# Patient Record
Sex: Female | Born: 1995 | Race: Black or African American | Hispanic: No | Marital: Single | Smoking: Never smoker
Health system: Southern US, Community
[De-identification: ages and names within clinical notes are randomized; demographics above are authoritative.]

## PROBLEM LIST (undated history)

## (undated) DIAGNOSIS — C801 Malignant (primary) neoplasm, unspecified: Secondary | ICD-10-CM

## (undated) DIAGNOSIS — J45909 Unspecified asthma, uncomplicated: Secondary | ICD-10-CM

## (undated) HISTORY — PX: BREAST SURGERY: SHX581

---

## 2013-04-01 DIAGNOSIS — O009 Unspecified ectopic pregnancy without intrauterine pregnancy: Secondary | ICD-10-CM

## 2013-04-01 HISTORY — DX: Unspecified ectopic pregnancy without intrauterine pregnancy: O00.90

## 2019-04-30 ENCOUNTER — Emergency Department (HOSPITAL_BASED_OUTPATIENT_CLINIC_OR_DEPARTMENT_OTHER): Payer: Medicaid Other

## 2019-04-30 ENCOUNTER — Other Ambulatory Visit: Payer: Self-pay

## 2019-04-30 ENCOUNTER — Emergency Department (HOSPITAL_BASED_OUTPATIENT_CLINIC_OR_DEPARTMENT_OTHER)
Admission: EM | Admit: 2019-04-30 | Discharge: 2019-04-30 | Disposition: A | Discharge status: Home / Self Care | Payer: Medicaid Other | Attending: Emergency Medicine | Admitting: Emergency Medicine

## 2019-04-30 ENCOUNTER — Encounter (HOSPITAL_BASED_OUTPATIENT_CLINIC_OR_DEPARTMENT_OTHER): Payer: Self-pay

## 2019-04-30 DIAGNOSIS — J45909 Unspecified asthma, uncomplicated: Secondary | ICD-10-CM | POA: Insufficient documentation

## 2019-04-30 DIAGNOSIS — Z3A00 Weeks of gestation of pregnancy not specified: Secondary | ICD-10-CM | POA: Diagnosis not present

## 2019-04-30 DIAGNOSIS — Z8759 Personal history of other complications of pregnancy, childbirth and the puerperium: Secondary | ICD-10-CM | POA: Insufficient documentation

## 2019-04-30 DIAGNOSIS — O209 Hemorrhage in early pregnancy, unspecified: Secondary | ICD-10-CM | POA: Diagnosis present

## 2019-04-30 DIAGNOSIS — N939 Abnormal uterine and vaginal bleeding, unspecified: Secondary | ICD-10-CM

## 2019-04-30 HISTORY — DX: Unspecified asthma, uncomplicated: J45.909

## 2019-04-30 LAB — URINALYSIS, ROUTINE W REFLEX MICROSCOPIC
Bilirubin Urine: NEGATIVE
Glucose, UA: NEGATIVE mg/dL
Ketones, ur: NEGATIVE mg/dL
Leukocytes,Ua: NEGATIVE
Nitrite: NEGATIVE
Protein, ur: NEGATIVE mg/dL
Specific Gravity, Urine: >1.03 — ABNORMAL HIGH (ref 1.005–1.030)
pH: 6 (ref 5.0–8.0)

## 2019-04-30 LAB — BASIC METABOLIC PANEL
Anion gap: 3 — ABNORMAL LOW (ref 5–15)
BUN: 14 mg/dL (ref 6–20)
CO2: 25 mmol/L (ref 22–32)
Calcium: 9.1 mg/dL (ref 8.9–10.3)
Chloride: 110 mmol/L (ref 98–111)
Creatinine, Ser: 0.67 mg/dL (ref 0.44–1.00)
GFR calc Af Amer: >60 mL/min (ref >60)
GFR calc non Af Amer: >60 mL/min (ref >60)
Glucose, Bld: 95 mg/dL (ref 70–99)
Potassium: 3.5 mmol/L (ref 3.5–5.1)
Sodium: 138 mmol/L (ref 135–145)

## 2019-04-30 LAB — URINALYSIS, MICROSCOPIC (REFLEX)

## 2019-04-30 LAB — CBC WITH DIFFERENTIAL/PLATELET
Abs Immature Granulocytes: 0.03 10*3/uL (ref 0.00–0.07)
Basophils Absolute: 0.1 10*3/uL (ref 0.0–0.1)
Basophils Relative: 1 %
Eosinophils Absolute: 1.4 10*3/uL — ABNORMAL HIGH (ref 0.0–0.5)
Eosinophils Relative: 11 %
HCT: 38.5 % (ref 36.0–46.0)
Hemoglobin: 12.8 g/dL (ref 12.0–15.0)
Immature Granulocytes: 0 %
Lymphocytes Relative: 20 %
Lymphs Abs: 2.4 10*3/uL (ref 0.7–4.0)
MCH: 30 pg (ref 26.0–34.0)
MCHC: 33.2 g/dL (ref 30.0–36.0)
MCV: 90.2 fL (ref 80.0–100.0)
Monocytes Absolute: 0.9 10*3/uL (ref 0.1–1.0)
Monocytes Relative: 8 %
Neutro Abs: 7.2 10*3/uL (ref 1.7–7.7)
Neutrophils Relative %: 60 %
Platelets: 267 10*3/uL (ref 150–400)
RBC: 4.27 MIL/uL (ref 3.87–5.11)
RDW: 13.6 % (ref 11.5–15.5)
WBC: 12 10*3/uL — ABNORMAL HIGH (ref 4.0–10.5)
nRBC: 0 % (ref 0.0–0.2)

## 2019-04-30 LAB — HCG, QUANTITATIVE, PREGNANCY: hCG, Beta Chain, Quant, S: 12 m[IU]/mL — ABNORMAL HIGH (ref <5)

## 2019-04-30 LAB — PREGNANCY, URINE: Preg Test, Ur: NEGATIVE

## 2019-04-30 NOTE — Discharge Instructions (Addendum)
It is important that you see your OB specialist by Monday at the latest to have your hCG hormone rechecked.  If they are unable to see you in that timeframe, you may report to the maternity admissions unit at [facility]  This is on the campus of the [facility]. If you have worsening abdominal pain or significant worsening bleeding, report to the maternity admissions unit for more emergent reevaluation.

## 2019-04-30 NOTE — ED Notes (Signed)
P  7 G4     L4 A 2

## 2019-04-30 NOTE — ED Notes (Signed)
Pt to US.

## 2019-04-30 NOTE — ED Provider Notes (Signed)
[place] EMERGENCY DEPARTMENT Provider Note   CSN: [id number] Arrival date & time: 04/30/19  1214     History Chief Complaint  Patient presents with  . Vaginal Bleeding    Judith Lewis is a 24 y.o. female [place], presenting to the emergency department with vaginal bleeding that began this morning.  She states she is having some light bright red vaginal bleeding with associated menstrual cramping.  She has having more pain on her left lower quadrant.  She states she was evaluated yesterday by her OB/GYN, Dr. Micah Noel, in [facility].  She had positive urine pregnancy test in the clinic as well as positive home pregnancy test.  She has not had any ultrasounds for this pregnancy yet.  She was not having any symptoms yesterday.  LMP March 26, 2019. she does have history of ectopic pregnancy, that resulted in removal of her left fallopian tube she reports.  She gave birth to her most recent child on August 31, 2018.  No fevers. Per chart review in care everywhere, patient has had multiple type and screens with blood type of O+.  The history is provided by the patient.       Past Medical History:  Diagnosis Date  . Asthma   . Ectopic pregnancy 2015    There are no problems to display for this patient.   History reviewed. No pertinent surgical history.   OB History    Gravida  1   Para      Term      Preterm      AB      Living        SAB      TAB      Ectopic      Multiple      Live Births              No family history on file.  Social History   Tobacco Use  . Smoking status: Never Smoker  . Smokeless tobacco: Never Used  Substance Use Topics  . Alcohol use: Never  . Drug use: Never    Home Medications Prior to Admission medications   Not on File    Allergies    Patient has no known allergies.  Review of Systems   Review of Systems  Gastrointestinal: Positive for abdominal pain.  Genitourinary: Positive for vaginal bleeding.    All other systems reviewed and are negative.     Physical Exam Updated Vital Signs BP 108/79 (BP Location: Right Arm)   Pulse 82   Temp 97.9 F (36.6 C) (Oral)   Resp 18   Ht 5\' 9"  (1.753 m)   Wt 64.9 kg   LMP 03/27/2019   SpO2 99%   BMI 21.12 kg/m   Physical Exam Vitals and nursing note reviewed.  Constitutional:      General: She is not in acute distress.    Appearance: She is well-developed. She is not ill-appearing.  HENT:     Head: Normocephalic and atraumatic.  Eyes:     Conjunctiva/sclera: Conjunctivae normal.  Cardiovascular:     Rate and Rhythm: Normal rate and regular rhythm.  Pulmonary:     Effort: Pulmonary effort is normal. No respiratory distress.     Breath sounds: Normal breath sounds.  Abdominal:     General: Bowel sounds are normal.     Palpations: Abdomen is soft.     Tenderness: There is no abdominal tenderness. There is no guarding or rebound.  Skin:    General: Skin is warm.  Neurological:     Mental Status: She is alert.  Psychiatric:        Behavior: Behavior normal.     ED Results / Procedures / Treatments   Labs (all labs ordered are listed, but only abnormal results are displayed) Labs Reviewed  URINALYSIS, ROUTINE W REFLEX MICROSCOPIC - Abnormal; Notable for the following components:      Result Value   APPearance CLOUDY (*)    Specific Gravity, Urine >1.030 (*)    Hgb urine dipstick LARGE (*)    All other components within normal limits  URINALYSIS, MICROSCOPIC (REFLEX) - Abnormal; Notable for the following components:   Bacteria, UA MANY (*)    All other components within normal limits  HCG, QUANTITATIVE, PREGNANCY - Abnormal; Notable for the following components:   hCG, Beta Chain, Quant, S 12 (*)    All other components within normal limits  BASIC METABOLIC PANEL - Abnormal; Notable for the following components:   Anion gap 3 (*)    All other components within normal limits  CBC WITH DIFFERENTIAL/PLATELET - Abnormal;  Notable for the following components:   WBC 12.0 (*)    Eosinophils Absolute 1.4 (*)    All other components within normal limits  PREGNANCY, URINE    EKG None  Radiology US OB LESS THAN 14 WEEKS WITH OB TRANSVAGINAL  Result Date: 04/30/2019 CLINICAL DATA:  Vaginal bleeding in first trimester of pregnancy; LMP 03/26/2019; quantitative beta HCG 12 EXAM: OBSTETRIC <14 WK [place] AND TRANSVAGINAL OB US TECHNIQUE: Both transabdominal and transvaginal ultrasound examinations were performed for complete evaluation of the gestation as well as the maternal uterus, adnexal regions, and pelvic cul-de-sac. Transvaginal technique was performed to assess early pregnancy. COMPARISON:  None FINDINGS: Intrauterine gestational sac: Absent Yolk sac:  N/A Embryo:  N/A Cardiac Activity: N/A Heart Rate: N/A  bpm MSD:   mm    w     d CRL:    mm    w    d                  [place] EDC: Subchorionic hemorrhage:  N/A Maternal uterus/adnexae: Uterus anteverted, normal in morphology. Questionable tiny myometrial cyst at anterior mid uterus, subserosal, 3 mm. No intrauterine gestational sac identified or endometrial fluid. Endometrial complex 7 mm thick. Ovaries normal in size and morphology. No free pelvic fluid or adnexal masses. IMPRESSION: No intrauterine gestation identified. Findings are consistent with pregnancy of unknown location. Differential diagnosis includes early ectopic pregnancy too early to visualize, spontaneous abortion, and ectopic pregnancy. Serial quantitative beta HCG and or follow-up ultrasound recommended to definitively exclude ectopic pregnancy. Electronically Signed   By: Lavonia Dana M.D.   On: 04/30/2019 15:00    Procedures Procedures (including critical care time)  Medications Ordered in ED Medications - No data to display  ED Course  I have reviewed the triage vital signs and the nursing notes.  Pertinent labs & imaging results that were available during my care of the patient were reviewed by me  and considered in my medical decision making (see chart for details).    MDM Rules/Calculators/A&P                      Patient presenting with light vaginal bleeding that began today after positive urine pregnancy test x2, as recently as yesterday at her OB office, per patient.  She is having some lower abdominal discomfort  though states it is not as severe as when she had prior ectopic pregnancy on the left.  LMP March 26, 2019.  She has not had any formal ultrasound for this pregnancy.  On exam, abdomen is soft and nontender.  Initial urine pregnancy was negative, however quantitative hCG is 12.  Per chart review, patient's blood type is O+.  Discussed with Dr. Johnney Killian.  Ultrasound done without evidence of intrauterine pregnancy or ectopic.  This may be due to very early ectopic versus spontaneous abortion.  Discussed this with patient.  Informed patient of importance of close follow-up with her OB by Monday for repeat hCG level and trending.  She is in no acute distress at this time, hemodynamically stable with stable hemoglobin.  Should she develop severely worsening bleeding or significantly worsening abdominal pain, she is instructed to report to the [facility], for more emergent care.  Patient verbalized understanding and agrees with care plan for discharge at this time.  Discussed results, findings, treatment and follow up. Patient advised of return precautions. Patient verbalized understanding and agreed with plan.  Final Clinical Impression(s) / ED Diagnoses Final diagnoses:  Vaginal bleeding in pregnancy, first trimester    Rx / DC Orders ED Discharge Orders    None       [place] Mccuen, [place] N, PA-C 04/30/19 1620    Charlesetta Shanks, MD 05/01/19 [phone]

## 2019-04-30 NOTE — ED Triage Notes (Signed)
Pt arrives ambulatory to ED with reports of a positive home and doctor pregnancy test yesterday with last menstrual cycle on Dec 25th. Pt reports she started having cramping and light bleeding today.

## 2020-01-25 ENCOUNTER — Emergency Department (HOSPITAL_COMMUNITY)
Admission: EM | Admit: 2020-01-25 | Discharge: 2020-01-25 | Disposition: A | Discharge status: Home / Self Care | Payer: Medicaid Other | Attending: Emergency Medicine | Admitting: Emergency Medicine

## 2020-01-25 ENCOUNTER — Emergency Department (HOSPITAL_COMMUNITY): Payer: Medicaid Other

## 2020-01-25 ENCOUNTER — Other Ambulatory Visit: Payer: Self-pay

## 2020-01-25 ENCOUNTER — Encounter (HOSPITAL_COMMUNITY): Payer: Self-pay

## 2020-01-25 DIAGNOSIS — O218 Other vomiting complicating pregnancy: Secondary | ICD-10-CM | POA: Diagnosis not present

## 2020-01-25 DIAGNOSIS — J45909 Unspecified asthma, uncomplicated: Secondary | ICD-10-CM | POA: Insufficient documentation

## 2020-01-25 DIAGNOSIS — Z3A20 20 weeks gestation of pregnancy: Secondary | ICD-10-CM | POA: Diagnosis not present

## 2020-01-25 DIAGNOSIS — Z3490 Encounter for supervision of normal pregnancy, unspecified, unspecified trimester: Secondary | ICD-10-CM

## 2020-01-25 DIAGNOSIS — Z853 Personal history of malignant neoplasm of breast: Secondary | ICD-10-CM | POA: Diagnosis not present

## 2020-01-25 DIAGNOSIS — K920 Hematemesis: Secondary | ICD-10-CM

## 2020-01-25 HISTORY — DX: Malignant (primary) neoplasm, unspecified: C80.1

## 2020-01-25 LAB — CBC
HCT: 33.1 % — ABNORMAL LOW (ref 36.0–46.0)
Hemoglobin: 11.1 g/dL — ABNORMAL LOW (ref 12.0–15.0)
MCH: 30.1 pg (ref 26.0–34.0)
MCHC: 33.5 g/dL (ref 30.0–36.0)
MCV: 89.7 fL (ref 80.0–100.0)
Platelets: 198 10*3/uL (ref 150–400)
RBC: 3.69 MIL/uL — ABNORMAL LOW (ref 3.87–5.11)
RDW: 13.4 % (ref 11.5–15.5)
WBC: 8.7 10*3/uL (ref 4.0–10.5)
nRBC: 0 % (ref 0.0–0.2)

## 2020-01-25 LAB — URINALYSIS, ROUTINE W REFLEX MICROSCOPIC
Bilirubin Urine: NEGATIVE
Glucose, UA: NEGATIVE mg/dL
Hgb urine dipstick: NEGATIVE
Ketones, ur: 80 mg/dL — AB
Nitrite: NEGATIVE
Protein, ur: 30 mg/dL — AB
Specific Gravity, Urine: 1.026 (ref 1.005–1.030)
pH: 7 (ref 5.0–8.0)

## 2020-01-25 LAB — COMPREHENSIVE METABOLIC PANEL
ALT: 31 U/L (ref 0–44)
AST: 25 U/L (ref 15–41)
Albumin: 3.1 g/dL — ABNORMAL LOW (ref 3.5–5.0)
Alkaline Phosphatase: 89 U/L (ref 38–126)
Anion gap: 7 (ref 5–15)
BUN: 7 mg/dL (ref 6–20)
CO2: 22 mmol/L (ref 22–32)
Calcium: 8.8 mg/dL — ABNORMAL LOW (ref 8.9–10.3)
Chloride: 106 mmol/L (ref 98–111)
Creatinine, Ser: 0.71 mg/dL (ref 0.44–1.00)
GFR, Estimated: >60 mL/min (ref >60)
Glucose, Bld: 114 mg/dL — ABNORMAL HIGH (ref 70–99)
Potassium: 3 mmol/L — ABNORMAL LOW (ref 3.5–5.1)
Sodium: 135 mmol/L (ref 135–145)
Total Bilirubin: 0.4 mg/dL (ref 0.3–1.2)
Total Protein: 6.4 g/dL — ABNORMAL LOW (ref 6.5–8.1)

## 2020-01-25 LAB — LIPASE, BLOOD: Lipase: 21 U/L (ref 11–51)

## 2020-01-25 MED ORDER — CHLORHEXIDINE GLUCONATE CLOTH 2 % EX PADS: 6.0000 | MEDICATED_PAD | Freq: Every day | CUTANEOUS | Status: DC

## 2020-01-25 MED ORDER — SODIUM CHLORIDE 0.9% FLUSH: 10.0000 mL | INTRAVENOUS | Status: DC | PRN

## 2020-01-25 MED ORDER — OMEPRAZOLE 20 MG PO CPDR: 20.0000 mg | DELAYED_RELEASE_CAPSULE | Freq: Every day | ORAL | 0 refills | Status: DC

## 2020-01-25 NOTE — ED Triage Notes (Signed)
Pt reports she started vomiting blood yesterday, no vomiting today. Pt denies abd pain, hx of breast cancer, currently receiving chemo treatment and states she is [redacted] weeks pregnant.

## 2020-01-25 NOTE — ED Provider Notes (Signed)
[place] EMERGENCY DEPARTMENT Provider Note   CSN: [id number] Arrival date & time: 01/25/20  1147     History Chief Complaint  Patient presents with  . Hematemesis    Breeonna Mone is a 24 y.o. female.  Adilenne Ashworth is a 24 y.o. G8P4 20-week pregnant AAF with a significant PMHx of breast cancer currently being treated with chemotherapy who presents to the ED with the chief complaint of hematemasis. Ms. Seidenberg went to the ED yesterday for the same complaints, but eloped without full work-up due to the long wait time. Today she states that she had 2 episodes of hematemasis yesterday morning-- one when she woke up and one after her OBGYN prenatal appointment which was larger volume (unable to quantify). She describes the blood as bright red in color. She denies coughing up any blood and states she has not had any episodes of emesis today. Patient reports fairly regular nausea and vomiting however this was the first time she has ever seen blood which concerned her.  Has not had any vomiting at all since yesterday, no changes in stools.  She also denies any chest pain or shortness of breath, abdominal pain or changes to her bowel movements or hematochezia. Her last bowel movement was this morning and was formed. Ms. Lager also states that she received her last chemo treatment 2 weeks ago. She endorses having adverse reactions to her chemotherapy during past treatments but states that these reactions usually occur closer to the time of her infusions. She was unable to recall which chemo regimen she was on at this time and has her next scheduled infusion next week. She has not been around any new sick contacts to her knowledge and has not changed her diet significantly in the recent days.        Past Medical History:  Diagnosis Date  . Asthma   . Cancer [facility])    breast  . Ectopic pregnancy 2015    There are no problems to display for this patient.   History reviewed.  No pertinent surgical history.   OB History    Gravida  1   Para      Term      Preterm      AB      Living        SAB      TAB      Ectopic      Multiple      Live Births              No family history on file.  Social History   Tobacco Use  . Smoking status: Never Smoker  . Smokeless tobacco: Never Used  Substance Use Topics  . Alcohol use: Never  . Drug use: Never    Home Medications Prior to Admission medications   Medication Sig Start Date End Date Taking? Authorizing Provider  omeprazole (PRILOSEC) 20 MG capsule Take 1 capsule (20 mg total) by mouth daily for 14 days. 01/25/20 02/08/20  Tacy Learn, PA-C    Allergies    Patient has no known allergies.  Review of Systems   Review of Systems  Constitutional: Negative for chills and fever.  Respiratory: Negative for shortness of breath.   Cardiovascular: Negative for chest pain.  Gastrointestinal: Positive for nausea and vomiting. Negative for abdominal pain, blood in stool, constipation and diarrhea.  Genitourinary: Negative for pelvic pain, vaginal bleeding and vaginal discharge.  Musculoskeletal: Negative for back pain.  Skin: Negative for rash and wound.  Allergic/Immunologic: Positive for immunocompromised state.  Neurological: Negative for dizziness and weakness.  Hematological: Does not bruise/bleed easily.  All other systems reviewed and are negative.   Physical Exam Updated Vital Signs BP 112/79   Pulse 88   Temp 98.2 F (36.8 C) (Oral)   Resp 16   Ht 5\' 9"  (1.753 m)   Wt 70.8 kg   LMP 08/19/2018   SpO2 100%   BMI 23.04 kg/m   Physical Exam Vitals and nursing note reviewed.  Constitutional:      General: She is not in acute distress.    Appearance: She is well-developed. She is not diaphoretic.  HENT:     Head: Normocephalic and atraumatic.     Mouth/Throat:     Mouth: Mucous membranes are moist.  Cardiovascular:     Rate and Rhythm: Normal rate and regular  rhythm.     Pulses: Normal pulses.     Heart sounds: Normal heart sounds.  Pulmonary:     Effort: Pulmonary effort is normal.     Breath sounds: Normal breath sounds.  Abdominal:     Tenderness: There is no abdominal tenderness.     Comments: Gravid   Musculoskeletal:     Right lower leg: No edema.     Left lower leg: No edema.  Skin:    General: Skin is warm and dry.     Findings: No erythema or rash.  Neurological:     Mental Status: She is alert and oriented to person, place, and time.  Psychiatric:        Behavior: Behavior normal.     ED Results / Procedures / Treatments   Labs (all labs ordered are listed, but only abnormal results are displayed) Labs Reviewed  COMPREHENSIVE METABOLIC PANEL - Abnormal; Notable for the following components:      Result Value   Potassium 3.0 (*)    Glucose, Bld 114 (*)    Calcium 8.8 (*)    Total Protein 6.4 (*)    Albumin 3.1 (*)    All other components within normal limits  CBC - Abnormal; Notable for the following components:   RBC 3.69 (*)    Hemoglobin 11.1 (*)    HCT 33.1 (*)    All other components within normal limits  URINALYSIS, ROUTINE W REFLEX MICROSCOPIC - Abnormal; Notable for the following components:   APPearance HAZY (*)    Ketones, ur 80 (*)    Protein, ur 30 (*)    Leukocytes,Ua SMALL (*)    Bacteria, UA RARE (*)    All other components within normal limits  URINE CULTURE  LIPASE, BLOOD  I-STAT BETA HCG BLOOD, ED ([facility], [facility], [facility] ONLY)    EKG None  Radiology DG Chest 1 View  Result Date: 01/25/2020 CLINICAL DATA:  PICC line placement. EXAM: CHEST  1 VIEW COMPARISON:  10/23/2019 FINDINGS: Left PICC line tip projects at the distal SVC level. The patient has a left subclavian Port-A-Cath with the tip overlying the mid SVC level. The lungs are clear without focal pneumonia, edema, pneumothorax or pleural effusion. The cardiopericardial silhouette is within normal limits for size. Convex rightward lower thoracic  scoliosis evident. IMPRESSION: Left PICC line tip overlies the distal SVC level. Electronically Signed   By: Misty Stanley M.D.   On: 01/25/2020 12:45    Procedures Procedures (including critical care time)  Medications Ordered in ED Medications  sodium chloride flush (NS) 0.9 % injection  10-40 mL (has no administration in time range)  Chlorhexidine Gluconate Cloth 2 % PADS 6 each (has no administration in time range)    ED Course  I have reviewed the triage vital signs and the nursing notes.  Pertinent labs & imaging results that were available during my care of the patient were reviewed by me and considered in my medical decision making (see chart for details).  Clinical Course as of Jan 24 1630  Tue Jan 25, 3476  1943 24 year old female with complaint of bright red blood in emesis x 2 yesterday, no vomiting since that time, otherwise feeling well today. Patient is on chemo for breast cancer (last chemo was 2 weeks ago), also [redacted] weeks pregnant.  On exam, patient is well appearing, vitals reassuring. Abdomen is gravid, non tender.  Patient reports nausea and vomiting frequently at baseline, question gastritis vs small tear. CXR unremarkable. Plan is to monitor vitals, check CBC, if stable and asymptomatic, will plan for PPI and dc to follow up with GI and her oncologist.    [LM]    Clinical Course User Index [LM] Roque Lias   MDM Rules/Calculators/A&P                          Final Clinical Impression(s) / ED Diagnoses Final diagnoses:  Hematemesis with nausea  Pregnancy, unspecified gestational age    Rx / DC Orders ED Discharge Orders         Ordered    omeprazole (PRILOSEC) 20 MG capsule  Daily        01/25/20 1526           Tacy Learn, PA-C 01/25/20 1631    Lucrezia Starch, MD 01/26/20 [phone]

## 2020-01-25 NOTE — Discharge Instructions (Addendum)
Take omeprazole daily as prescribed. Follow-up with your OB and oncology teams.

## 2020-01-27 LAB — URINE CULTURE: Culture: 6000 — AB

## 2020-01-28 ENCOUNTER — Telehealth: Payer: Self-pay | Admitting: *Deleted

## 2020-01-28 NOTE — Telephone Encounter (Signed)
Post ED Visit - Positive Culture Follow-up: Successful Patient Follow-Up  Culture assessed and recommendations reviewed by:  []  Elenor Quinones, Pharm.D. []  Heide Guile, Pharm.D., BCPS AQ-ID []  Parks Neptune, Pharm.D., BCPS []  Alycia Rossetti, Pharm.D., BCPS []  [place], Pharm.D., BCPS, AAHIVP []  Legrand Como, Pharm.D., BCPS, AAHIVP [x]  Salome Arnt, PharmD, BCPS []  Johnnette Gourd, PharmD, BCPS []  Hughes Better, PharmD, BCPS []  Leeroy Cha, PharmD  Positive urine culture  [x]  Patient discharged without antimicrobial prescription and treatment is now indicated []  Organism is resistant to prescribed ED discharge antimicrobial []  Patient with positive blood cultures  Changes discussed with ED provider Sharyn Lull, PA-C New antibiotic prescription Amoxicillin 875mg  PO BID x 5 days Called to [facility] [phone]  Contacted patient, date 01/27/2020, time [place], [place] 01/28/2020, 8:49 AM

## 2020-01-28 NOTE — Progress Notes (Signed)
ED Antimicrobial Stewardship Positive Culture Follow Up   Judith Lewis is an 24 y.o. female who presented to [facility] on 01/25/2020 with a chief complaint of  Chief Complaint  Patient presents with  . Hematemesis    Recent Results (from the past 720 hour(s))  Urine culture     Status: Abnormal   Collection Time: 01/25/20 11:53 AM   Specimen: Urine, Random  Result Value Ref Range Status   Specimen Description URINE, RANDOM  Final   Special Requests NONE  Final   Culture (A)  Final    6,000 COLONIES/mL GROUP B STREP(S.AGALACTIAE)ISOLATED TESTING AGAINST S. AGALACTIAE NOT ROUTINELY PERFORMED DUE TO PREDICTABILITY OF AMP/PEN/VAN SUSCEPTIBILITY. Performed at [facility], [place] [address] [place], [place] [address]    Report Status 01/27/2020 FINAL  Final    []  Treated with N/A, organism resistant to prescribed antimicrobial [x]  Patient discharged originally without antimicrobial agent and treatment is now indicated  New antibiotic prescription: amoxicillin 875mg  PO BID x 5 days  ED Provider: Sharyn Lull, PA-C   Box, Rande Lawman 01/28/2020, 8:30 AM Clinical Pharmacist Monday - Friday phone -  [phone] Saturday - Sunday phone - [phone]

## 2020-08-20 ENCOUNTER — Encounter (HOSPITAL_BASED_OUTPATIENT_CLINIC_OR_DEPARTMENT_OTHER): Payer: Self-pay | Admitting: *Deleted

## 2020-08-20 ENCOUNTER — Other Ambulatory Visit: Payer: Self-pay

## 2020-08-20 ENCOUNTER — Emergency Department (HOSPITAL_BASED_OUTPATIENT_CLINIC_OR_DEPARTMENT_OTHER)
Admission: EM | Admit: 2020-08-20 | Discharge: 2020-08-20 | Disposition: A | Discharge status: Home / Self Care | Payer: Medicaid Other | Attending: Emergency Medicine | Admitting: Emergency Medicine

## 2020-08-20 ENCOUNTER — Emergency Department (HOSPITAL_BASED_OUTPATIENT_CLINIC_OR_DEPARTMENT_OTHER): Payer: Medicaid Other

## 2020-08-20 DIAGNOSIS — R1010 Upper abdominal pain, unspecified: Secondary | ICD-10-CM

## 2020-08-20 DIAGNOSIS — R109 Unspecified abdominal pain: Secondary | ICD-10-CM | POA: Diagnosis present

## 2020-08-20 DIAGNOSIS — E876 Hypokalemia: Secondary | ICD-10-CM | POA: Insufficient documentation

## 2020-08-20 DIAGNOSIS — Z853 Personal history of malignant neoplasm of breast: Secondary | ICD-10-CM | POA: Diagnosis not present

## 2020-08-20 DIAGNOSIS — R197 Diarrhea, unspecified: Secondary | ICD-10-CM | POA: Insufficient documentation

## 2020-08-20 DIAGNOSIS — J45909 Unspecified asthma, uncomplicated: Secondary | ICD-10-CM | POA: Insufficient documentation

## 2020-08-20 LAB — URINALYSIS, ROUTINE W REFLEX MICROSCOPIC
Bilirubin Urine: NEGATIVE
Glucose, UA: NEGATIVE mg/dL
Ketones, ur: NEGATIVE mg/dL
Leukocytes,Ua: NEGATIVE
Nitrite: NEGATIVE
Protein, ur: NEGATIVE mg/dL
Specific Gravity, Urine: 1.025 (ref 1.005–1.030)
pH: 6.5 (ref 5.0–8.0)

## 2020-08-20 LAB — CBC WITH DIFFERENTIAL/PLATELET
Abs Immature Granulocytes: 0.01 10*3/uL (ref 0.00–0.07)
Basophils Absolute: 0 10*3/uL (ref 0.0–0.1)
Basophils Relative: 1 %
Eosinophils Absolute: 0.6 10*3/uL — ABNORMAL HIGH (ref 0.0–0.5)
Eosinophils Relative: 15 %
HCT: 36.6 % (ref 36.0–46.0)
Hemoglobin: 12.2 g/dL (ref 12.0–15.0)
Immature Granulocytes: 0 %
Lymphocytes Relative: 20 %
Lymphs Abs: 0.8 10*3/uL (ref 0.7–4.0)
MCH: 30 pg (ref 26.0–34.0)
MCHC: 33.3 g/dL (ref 30.0–36.0)
MCV: 89.9 fL (ref 80.0–100.0)
Monocytes Absolute: 0.6 10*3/uL (ref 0.1–1.0)
Monocytes Relative: 14 %
Neutro Abs: 2 10*3/uL (ref 1.7–7.7)
Neutrophils Relative %: 50 %
Platelets: 307 10*3/uL (ref 150–400)
RBC: 4.07 MIL/uL (ref 3.87–5.11)
RDW: 16.3 % — ABNORMAL HIGH (ref 11.5–15.5)
WBC: 4.1 10*3/uL (ref 4.0–10.5)
nRBC: 0 % (ref 0.0–0.2)

## 2020-08-20 LAB — LIPASE, BLOOD: Lipase: 24 U/L (ref 11–51)

## 2020-08-20 LAB — COMPREHENSIVE METABOLIC PANEL
ALT: 16 U/L (ref 0–44)
AST: 17 U/L (ref 15–41)
Albumin: 4 g/dL (ref 3.5–5.0)
Alkaline Phosphatase: 94 U/L (ref 38–126)
Anion gap: 8 (ref 5–15)
BUN: 8 mg/dL (ref 6–20)
CO2: 24 mmol/L (ref 22–32)
Calcium: 9.1 mg/dL (ref 8.9–10.3)
Chloride: 106 mmol/L (ref 98–111)
Creatinine, Ser: 0.52 mg/dL (ref 0.44–1.00)
GFR, Estimated: >60 mL/min (ref >60)
Glucose, Bld: 85 mg/dL (ref 70–99)
Potassium: 3.1 mmol/L — ABNORMAL LOW (ref 3.5–5.1)
Sodium: 138 mmol/L (ref 135–145)
Total Bilirubin: 0.5 mg/dL (ref 0.3–1.2)
Total Protein: 7.7 g/dL (ref 6.5–8.1)

## 2020-08-20 LAB — URINALYSIS, MICROSCOPIC (REFLEX)

## 2020-08-20 LAB — PREGNANCY, URINE: Preg Test, Ur: NEGATIVE

## 2020-08-20 MED ORDER — POTASSIUM CHLORIDE ER 10 MEQ PO TBCR: 10.0000 meq | EXTENDED_RELEASE_TABLET | Freq: Every day | ORAL | 0 refills | Status: AC

## 2020-08-20 MED ORDER — OMEPRAZOLE 20 MG PO CPDR: 20.0000 mg | DELAYED_RELEASE_CAPSULE | Freq: Every day | ORAL | 0 refills | Status: AC

## 2020-08-20 NOTE — ED Provider Notes (Signed)
[facility] EMERGENCY DEPARTMENT Provider Note   CSN: [id number] Arrival date & time: 08/20/20  1337     History Chief Complaint  Patient presents with  . Abdominal Pain    Judith Lewis is a 25 y.o. female presenting for evaluation of abdominal pain.  Patient states that the past week she has had intermittent mid abdominal pain.  She describes it as a cramping.  When it comes on, last for several hours and then resolves without intervention.  She has taken Tylenol without significant provement in symptoms.  She has not tried anything else.  No associated fevers, chills, nausea, vomiting, urinary symptoms, normal bowel movements.  She reports 2 episodes of diarrhea yesterday, but she was started on an antibiotic by her PCP for presumed UTI.  No blood in her stool.  Her last period ended a few days ago, was normal for her.  She does not normally get stomach pain like this with her periods.  No history of similar.  She is currently undergoing treatment for breast cancer, status postmastectomy, she has finished chemo, is currently receiving radiation.  No other medical problems.  HPI     Past Medical History:  Diagnosis Date  . Asthma   . Cancer [facility])    breast  . Ectopic pregnancy 2015    There are no problems to display for this patient.   Past Surgical History:  Procedure Laterality Date  . BREAST SURGERY       OB History    Gravida  1   Para      Term      Preterm      AB      Living        SAB      IAB      Ectopic      Multiple      Live Births              No family history on file.  Social History   Tobacco Use  . Smoking status: Never Smoker  . Smokeless tobacco: Never Used  Substance Use Topics  . Alcohol use: Never  . Drug use: Never    Home Medications Prior to Admission medications   Medication Sig Start Date End Date Taking? Authorizing Provider  omeprazole (PRILOSEC) 20 MG capsule Take 1 capsule (20 mg total) by  mouth daily for 14 days. 01/25/20 02/08/20  Tacy Learn, PA-C    Allergies    Patient has no known allergies.  Review of Systems   Review of Systems  Gastrointestinal: Positive for abdominal pain and diarrhea.  All other systems reviewed and are negative.   Physical Exam Updated Vital Signs BP 118/86   Pulse 75   Temp 98.1 F (36.7 C) (Oral)   Resp 16   Ht 5\' 9"  (1.753 m)   Wt 62.6 kg   LMP 08/15/2020   SpO2 100%   Breastfeeding No   BMI 20.38 kg/m   Physical Exam Vitals and nursing note reviewed.  Constitutional:      General: She is not in acute distress.    Appearance: She is well-developed.     Comments: Appears nontoxic  HENT:     Head: Normocephalic and atraumatic.  Eyes:     Extraocular Movements: Extraocular movements intact.     Conjunctiva/sclera: Conjunctivae normal.     Pupils: Pupils are equal, round, and reactive to light.  Cardiovascular:     Rate and Rhythm: Normal rate  and regular rhythm.     Pulses: Normal pulses.  Pulmonary:     Effort: Pulmonary effort is normal. No respiratory distress.     Breath sounds: Normal breath sounds. No wheezing.  Abdominal:     General: There is no distension.     Palpations: Abdomen is soft. There is no mass.     Tenderness: There is abdominal tenderness. There is no guarding or rebound.     Comments: Mild ttp of upper abd. No rigidity or distention  Musculoskeletal:        General: Normal range of motion.     Cervical back: Normal range of motion and neck supple.  Skin:    General: Skin is warm and dry.     Capillary Refill: Capillary refill takes less than 2 seconds.  Neurological:     Mental Status: She is alert and oriented to person, place, and time.     ED Results / Procedures / Treatments   Labs (all labs ordered are listed, but only abnormal results are displayed) Labs Reviewed  CBC WITH DIFFERENTIAL/PLATELET - Abnormal; Notable for the following components:      Result Value   RDW 16.3 (*)     Eosinophils Absolute 0.6 (*)    All other components within normal limits  COMPREHENSIVE METABOLIC PANEL - Abnormal; Notable for the following components:   Potassium 3.1 (*)    All other components within normal limits  URINALYSIS, ROUTINE W REFLEX MICROSCOPIC - Abnormal; Notable for the following components:   Hgb urine dipstick SMALL (*)    All other components within normal limits  URINALYSIS, MICROSCOPIC (REFLEX) - Abnormal; Notable for the following components:   Bacteria, UA MANY (*)    All other components within normal limits  URINE CULTURE  LIPASE, BLOOD  PREGNANCY, URINE    EKG None  Radiology CT ABDOMEN PELVIS WO CONTRAST  Result Date: 08/20/2020 CLINICAL DATA:  Abdominal pain x1 week. EXAM: CT ABDOMEN AND PELVIS WITHOUT CONTRAST TECHNIQUE: Multidetector CT imaging of the abdomen and pelvis was performed following the standard protocol without IV contrast. COMPARISON:  None. FINDINGS: Lower chest: No acute abnormality. Hepatobiliary: No focal liver abnormality is seen. No gallstones, gallbladder wall thickening, or biliary dilatation. Pancreas: Unremarkable. No pancreatic ductal dilatation or surrounding inflammatory changes. Spleen: Normal in size without focal abnormality. Adrenals/Urinary Tract: Adrenal glands are unremarkable. Kidneys are normal, without renal calculi, focal lesion, or hydronephrosis. Bladder is unremarkable. Stomach/Bowel: Stomach is within normal limits. Appendix appears normal. No evidence of bowel wall thickening, distention, or inflammatory changes. Vascular/Lymphatic: No significant vascular findings are present. No enlarged abdominal or pelvic lymph nodes. Reproductive: Uterus and bilateral adnexa are unremarkable. Other: No abdominal wall hernia or abnormality. No abdominopelvic ascites. Musculoskeletal: Levoscoliosis of the lumbar spine is seen. IMPRESSION: No acute or active process within the abdomen or pelvis. Electronically Signed   By: Virgina Norfolk M.D.   On: 08/20/2020 15:32    Procedures Procedures   Medications Ordered in ED Medications - No data to display  ED Course  I have reviewed the triage vital signs and the nursing notes.  Pertinent labs & imaging results that were available during my care of the patient were reviewed by me and considered in my medical decision making (see chart for details).    MDM Rules/Calculators/A&P                          Patient presenting for evaluation of  abdominal pain.  On exam, patient is nontoxic.  She has mid tenderness palpation of upper abdomen.  No infectious symptoms such as fever, nausea, vomiting.  While she does have diarrhea, this began after starting antibiotics, less likely related to her pain.  However in the setting of radiation and recent treatment for breast cancer, will obtain labs and CT imaging to ensure no acute abnormality.  Labs interpreted by me, overall reassuring.  Mild hypokalemia 3.1, will have patient replenish orally.  Urine shows many bacteria, however no nitrites or leukocytes.  Will send urine culture.  She is already on antibiotics (started yesterday), as such, will not add on more.  CT abdomen pelvis negative for acute findings.  On reevaluation, patient remains nontoxic.  Discussed findings with patient.  Discussed symptomatic treatment and follow-up with PCP.  At this time, patient appears safe for discharge.  Return precautions given.  Patient states she understands and agrees to plan.  Final Clinical Impression(s) / ED Diagnoses Final diagnoses:  None    Rx / DC Orders ED Discharge Orders    None       Franchot Heidelberg, PA-C 08/20/20 1549    Hayden Rasmussen, MD 08/20/20 1752

## 2020-08-20 NOTE — ED Notes (Signed)
ED Provider at bedside. 

## 2020-08-20 NOTE — ED Notes (Signed)
States she has abd pain, onset approx one week ago. Went to a MD office and rec Rx. Having diarrhea, twice yesterday, able to eat and drink and keep PO down, denies nausea and vomiting.

## 2020-08-20 NOTE — ED Notes (Signed)
RUE - RESTRICTED EXTREMITY DUE TO LUMPECTOMY - RESTRICTED EXTREMITY BRACELET APPLIED

## 2020-08-20 NOTE — ED Notes (Signed)
NPO UNTIL FURTHER NOTICE

## 2020-08-20 NOTE — ED Notes (Signed)
Lab phoned to request urine culture be added to urine labs. Spoke with Arbie Cookey

## 2020-08-20 NOTE — ED Notes (Signed)
AVS provided to client, reviewed instructions as outlined by the ED Provider, pt teaching also provided on the 2 Rx's by the ED provider. Opportunity for questions provided as well prior to DC to home.

## 2020-08-20 NOTE — ED Notes (Signed)
Denies any urinary infections at this time, states menstrual cycle has changed since being on chemotherapy.

## 2020-08-20 NOTE — Discharge Instructions (Signed)
Take omeprazole daily.  Continue using Tylenol as needed for pain. Follow-up with your primary care doctor as needed if your pain persist. Start taking potassium pills as prescribed. Return to the emergency room if you develop high fevers, persistent vomiting, severe worsening abdominal pain

## 2020-08-20 NOTE — ED Triage Notes (Signed)
Pt reports dx with UTI last month- States she had 3 partial rounds of amoxicillin. Reports she is having mid abdominal pain- ? If r/t her period. She finished chemo on 3/8 and is undergoing radiation for breast ca

## 2020-08-22 LAB — URINE CULTURE: Culture: NO GROWTH

## 2020-09-16 ENCOUNTER — Emergency Department (HOSPITAL_BASED_OUTPATIENT_CLINIC_OR_DEPARTMENT_OTHER): Payer: Medicaid Other

## 2020-09-16 ENCOUNTER — Emergency Department (HOSPITAL_BASED_OUTPATIENT_CLINIC_OR_DEPARTMENT_OTHER)
Admission: EM | Admit: 2020-09-16 | Discharge: 2020-09-16 | Disposition: A | Discharge status: Home / Self Care | Payer: Medicaid Other | Attending: Emergency Medicine | Admitting: Emergency Medicine

## 2020-09-16 ENCOUNTER — Other Ambulatory Visit: Payer: Self-pay

## 2020-09-16 ENCOUNTER — Telehealth (HOSPITAL_BASED_OUTPATIENT_CLINIC_OR_DEPARTMENT_OTHER): Payer: Self-pay | Admitting: Emergency Medicine

## 2020-09-16 ENCOUNTER — Encounter (HOSPITAL_BASED_OUTPATIENT_CLINIC_OR_DEPARTMENT_OTHER): Payer: Self-pay | Admitting: *Deleted

## 2020-09-16 DIAGNOSIS — J069 Acute upper respiratory infection, unspecified: Secondary | ICD-10-CM | POA: Diagnosis not present

## 2020-09-16 DIAGNOSIS — J349 Unspecified disorder of nose and nasal sinuses: Secondary | ICD-10-CM | POA: Insufficient documentation

## 2020-09-16 DIAGNOSIS — Z20822 Contact with and (suspected) exposure to covid-19: Secondary | ICD-10-CM | POA: Insufficient documentation

## 2020-09-16 DIAGNOSIS — M96842 Postprocedural seroma of a musculoskeletal structure following a musculoskeletal system procedure: Secondary | ICD-10-CM | POA: Diagnosis not present

## 2020-09-16 DIAGNOSIS — Z853 Personal history of malignant neoplasm of breast: Secondary | ICD-10-CM | POA: Insufficient documentation

## 2020-09-16 DIAGNOSIS — R519 Headache, unspecified: Secondary | ICD-10-CM | POA: Diagnosis present

## 2020-09-16 DIAGNOSIS — J45909 Unspecified asthma, uncomplicated: Secondary | ICD-10-CM | POA: Diagnosis not present

## 2020-09-16 LAB — RESP PANEL BY RT-PCR (FLU A&B, COVID) ARPGX2
Influenza A by PCR: NEGATIVE
Influenza B by PCR: NEGATIVE
SARS Coronavirus 2 by RT PCR: POSITIVE — AB

## 2020-09-16 NOTE — ED Provider Notes (Signed)
[facility] EMERGENCY DEPARTMENT Provider Note   CSN: [id number] Arrival date & time: 09/16/20  1558     History Chief Complaint  Patient presents with   Wound Check    "Lightheaded"    Judith Lewis is a 25 y.o. female.  HPI     25 year old female with history of asthma, breast cancer  She had initially checked into the [facility] emergency department for head pressure, lightheadedness.  She reported she been seen at [facility] with a negative COVID. SHe then checked into [facility] ED. Stated she came due to severe headache. Told she had a sinus infection 6/15 on antibiotics. No fever, nausea or vomiting.   Reports all symptoms began 2 weeks ago  Went to [place] with headache 6/15.  Cough, sneezing, congestion Cephelexin Here today because symptoms continue Cough the same, nonproductive Hx of asthma  Finished surgery, radiation, chemotherapy Port removed on 6/15, began to have drainage, clear yellow drainage   No fevers No body aches, no sore throat No nausea or vomiting, eating and drinking ok March 18 chemo, radiation stopped June 10 Headaches for 2 weeks No known sick contacts other than son   Past Medical History:  Diagnosis Date   Asthma    Cancer ([place])    breast   Ectopic pregnancy 2015    There are no problems to display for this patient.   Past Surgical History:  Procedure Laterality Date   BREAST SURGERY       OB History     Gravida  1   Para      Term      Preterm      AB      Living         SAB      IAB      Ectopic      Multiple      Live Births              No family history on file.  Social History   Tobacco Use   Smoking status: Never   Smokeless tobacco: Never  Substance Use Topics   Alcohol use: Never   Drug use: Never    Home Medications Prior to Admission medications   Medication Sig Start Date End Date Taking? Authorizing Provider  omeprazole (PRILOSEC) 20 MG capsule Take 1  capsule (20 mg total) by mouth daily for 14 days. 08/20/20 09/03/20  Caccavale, Sophia, PA-C  potassium chloride (KLOR-CON) 10 MEQ tablet Take 1 tablet (10 mEq total) by mouth daily. 08/20/20   Caccavale, Sophia, PA-C    Allergies    Patient has no known allergies.  Review of Systems   Review of Systems  Constitutional:  Negative for fever.  Eyes:  Negative for visual disturbance.  Respiratory:  Positive for cough. Negative for shortness of breath.   Cardiovascular:  Negative for chest pain.  Gastrointestinal:  Negative for nausea and vomiting.  Skin:  Negative for rash.  Neurological:  Positive for headaches. Negative for weakness (has had generalized weakness, since radiation) and numbness.   Physical Exam Updated Vital Signs BP 125/89 (BP Location: Left Arm)   Pulse 98   Temp 98.4 F (36.9 C) (Oral)   Resp 17   Ht 5\' 9"  (1.753 m)   Wt 63 kg   LMP 09/07/2020   SpO2 99%   BMI 20.53 kg/m   Physical Exam Vitals and nursing note reviewed.  Constitutional:      General: She is  not in acute distress.    Appearance: Normal appearance. She is well-developed. She is not ill-appearing or diaphoretic.  HENT:     Head: Normocephalic and atraumatic.  Eyes:     General: No visual field deficit.    Extraocular Movements: Extraocular movements intact.     Conjunctiva/sclera: Conjunctivae normal.     Pupils: Pupils are equal, round, and reactive to light.  Cardiovascular:     Rate and Rhythm: Normal rate and regular rhythm.     Pulses: Normal pulses.     Heart sounds: Normal heart sounds. No murmur heard.   No friction rub. No gallop.  Pulmonary:     Effort: Pulmonary effort is normal. No respiratory distress.     Breath sounds: Normal breath sounds. No wheezing or rales.  Abdominal:     General: There is no distension.     Palpations: Abdomen is soft.     Tenderness: There is no abdominal tenderness. There is no guarding.  Musculoskeletal:        General: No swelling or  tenderness.     Cervical back: Normal range of motion.  Skin:    General: Skin is warm and dry.     Findings: No erythema or rash.     Comments: Left chest wall with previous portacath site, no fluctuance, no erythema, clear drainage from incision  Neurological:     General: No focal deficit present.     Mental Status: She is alert and oriented to person, place, and time.     GCS: GCS eye subscore is 4. GCS verbal subscore is 5. GCS motor subscore is 6.     Cranial Nerves: No cranial nerve deficit, dysarthria or facial asymmetry.     Sensory: No sensory deficit.     Motor: No weakness or tremor.     Coordination: Coordination normal. Finger-Nose-Finger Test normal.     Gait: Gait normal.    ED Results / Procedures / Treatments   Labs (all labs ordered are listed, but only abnormal results are displayed) Labs Reviewed  RESP PANEL BY RT-PCR (FLU A&B, COVID) ARPGX2 - Abnormal; Notable for the following components:      Result Value   SARS Coronavirus 2 by RT PCR POSITIVE (*)    All other components within normal limits  CBG MONITORING, ED    EKG EKG Interpretation  Date/Time:  Saturday September 16 2020 16:43:42 EDT Ventricular Rate:  82 PR Interval:  138 QRS Duration: 93 QT Interval:  362 QTC Calculation: 423 R Axis:   29 Text Interpretation: Sinus arrhythmia Borderline T wave abnormalities No previous ECGs available Confirmed by Gareth Morgan [phone]) on 09/16/2020 7:21:12 PM  Radiology DG Chest 2 View  Result Date: 09/16/2020 CLINICAL DATA:  Recent Port-A-Cath removal, drainage at surgical site, chest pain EXAM: CHEST - 2 VIEW COMPARISON:  01/25/2020 FINDINGS: Frontal and lateral views of the chest demonstrate a stable cardiac silhouette. No airspace disease, effusion, or pneumothorax. Interval removal of the left chest wall port and PICC line. No acute bony abnormalities. Stable right convex scoliosis. IMPRESSION: 1. No acute intrathoracic process. Electronically Signed   By:  Randa Ngo M.D.   On: 09/16/2020 19:17   CT Head Wo Contrast  Result Date: 09/16/2020 CLINICAL DATA:  Lightheadedness, head pressure EXAM: CT HEAD WITHOUT CONTRAST TECHNIQUE: Contiguous axial images were obtained from the base of the skull through the vertex without intravenous contrast. COMPARISON:  None. FINDINGS: Brain: No acute infarct or hemorrhage. Lateral ventricles and midline  structures are unremarkable. No acute extra-axial fluid collections. No mass effect. Vascular: No hyperdense vessel or unexpected calcification. Skull: Normal. Negative for fracture or focal lesion. Sinuses/Orbits: Mucosal thickening throughout the paranasal sinuses, with near complete opacification of the left frontal sinus, anterior ethmoid air cells, and right maxillary sinus. Other: None. IMPRESSION: 1. No acute intracranial process. 2. Pansinus disease. Electronically Signed   By: Randa Ngo M.D.   On: 09/16/2020 19:15    Procedures Procedures   Medications Ordered in ED Medications - No data to display  ED Course  I have reviewed the triage vital signs and the nursing notes.  Pertinent labs & imaging results that were available during my care of the patient were reviewed by me and considered in my medical decision making (see chart for details).    MDM Rules/Calculators/A&P                          25 year old female with a history of asthma and breast cancer presents with concern for cough for 2 weeks, congestion, sore throat, headaches, and drainage from around her removed Port-A-Cath site.  Previous Port-A-Cath site with area intact, no erythema, no purulent drainage, however there is clear drainage from the area, and suspect seroma of the chest wall.  Discussed recommend follow-up with her surgeon or oncologist regarding this and continued monitoring, with need for additional evaluation if she develops redness, fever, increased pain or purulent drainage.  CT head completed given cancer history  and headaches shows no significant abnormalities, signs of pansinus disease.  Chest x-ray shows no signs of pneumonia.  We ordered COVID-19 testing as she had previously had a negative antigen test.  She does describe symptoms going on for 2 weeks, and would not be a candidate for therapy.  Testing sent and recommend continuing antibiotics to have been started previously and following up with her outpatient physicians.   Final Clinical Impression(s) / ED Diagnoses Final diagnoses:  Paranasal sinus disease  New onset of headaches  Viral URI with cough  Seroma of musculoskeletal structure after musculoskeletal system procedure    Rx / DC Orders ED Discharge Orders     None        Gareth Morgan, MD 09/17/20 1134

## 2020-09-16 NOTE — ED Notes (Signed)
Pt refused CBG.

## 2020-09-16 NOTE — ED Triage Notes (Addendum)
Pt had portacath removed on June 15, voices concern bc the site is draining yellow fluid. States she has completed radiation. States earlier today she had "head pressure" so went to [facility] but left prior to being seen. Reports feeling light headed but pressure in head has eased. She is here with her 4 mo son who is also a patient. Reports she had a negative covid test this week. A&Ox4. After triage she c/o of chest pain so EKG done

## 2020-09-21 ENCOUNTER — Other Ambulatory Visit: Payer: Self-pay

## 2020-09-21 ENCOUNTER — Encounter (HOSPITAL_BASED_OUTPATIENT_CLINIC_OR_DEPARTMENT_OTHER): Payer: Self-pay | Admitting: *Deleted

## 2020-09-21 ENCOUNTER — Emergency Department (HOSPITAL_BASED_OUTPATIENT_CLINIC_OR_DEPARTMENT_OTHER)
Admission: EM | Admit: 2020-09-21 | Discharge: 2020-09-21 | Disposition: A | Discharge status: Home / Self Care | Payer: Medicaid Other | Attending: Emergency Medicine | Admitting: Emergency Medicine

## 2020-09-21 DIAGNOSIS — R519 Headache, unspecified: Secondary | ICD-10-CM | POA: Diagnosis present

## 2020-09-21 DIAGNOSIS — J45909 Unspecified asthma, uncomplicated: Secondary | ICD-10-CM | POA: Diagnosis not present

## 2020-09-21 DIAGNOSIS — Z7951 Long term (current) use of inhaled steroids: Secondary | ICD-10-CM | POA: Insufficient documentation

## 2020-09-21 DIAGNOSIS — Z853 Personal history of malignant neoplasm of breast: Secondary | ICD-10-CM | POA: Insufficient documentation

## 2020-09-21 DIAGNOSIS — U071 COVID-19: Secondary | ICD-10-CM | POA: Insufficient documentation

## 2020-09-21 MED ORDER — FLUTICASONE PROPIONATE 50 MCG/ACT NA SUSP: 2.0000 | Freq: Every day | NASAL | 0 refills | Status: AC

## 2020-09-21 NOTE — ED Triage Notes (Signed)
Head pressure. Negative Covid test last week.

## 2020-09-21 NOTE — ED Notes (Signed)
Pressure in head states no pain,  States Tested Positive for COVID on Saturday but has been asymptomatic, states wants new test to make sure she is postive.

## 2020-09-21 NOTE — Discharge Instructions (Addendum)
Rotate tylenol and motrin for pain  Use fluticasone as directed  Please follow up with your primary care provider within 5-7 days for re-evaluation of your symptoms. If you do not have a primary care provider, information for a healthcare clinic has been provided for you to make arrangements for follow up care. Please return to the emergency department for any new or worsening symptoms.

## 2020-09-21 NOTE — ED Provider Notes (Signed)
[facility] EMERGENCY DEPARTMENT Provider Note   CSN: [id number] Arrival date & time: 09/21/20  1253     History Chief Complaint  Patient presents with   Headache   Covid Positive    Judith Lewis is a 25 y.o. female.  HPI  25 year old female with a history of asthma, breast cancer, ectopic pregnancy, who presents to the emergency department today for evaluation of a headache.  States that she tested positive for COVID about 5 days ago.  Since then she has had several different symptoms which seem to be improving including her body aches and cough.  She is mainly concerned because today she is complaining of a headache.  She is also had nasal congestion.  She is tried taking Tylenol but it is not resolved her symptoms.  She does not have any associated neurologic complaints.  She is requesting that she be retested for COVID to see if she still has it.  Past Medical History:  Diagnosis Date   Asthma    Cancer [facility])    breast   Ectopic pregnancy 2015    There are no problems to display for this patient.   Past Surgical History:  Procedure Laterality Date   BREAST SURGERY       OB History     Gravida  1   Para      Term      Preterm      AB      Living         SAB      IAB      Ectopic      Multiple      Live Births              No family history on file.  Social History   Tobacco Use   Smoking status: Never   Smokeless tobacco: Never  Substance Use Topics   Alcohol use: Never   Drug use: Never    Home Medications Prior to Admission medications   Medication Sig Start Date End Date Taking? Authorizing Provider  fluticasone (FLONASE) 50 MCG/ACT nasal spray Place 2 sprays into both nostrils daily. 09/21/20  Yes Nandan Willems S, PA-C  omeprazole (PRILOSEC) 20 MG capsule Take 1 capsule (20 mg total) by mouth daily for 14 days. 08/20/20 09/03/20  Caccavale, Sophia, PA-C  potassium chloride (KLOR-CON) 10 MEQ tablet Take 1 tablet (10  mEq total) by mouth daily. 08/20/20   Caccavale, Sophia, PA-C    Allergies    Patient has no known allergies.  Review of Systems   Review of Systems  Constitutional:  Negative for fever.  HENT:  Negative for ear pain and sore throat.   Eyes:  Negative for visual disturbance.  Respiratory:  Negative for cough and shortness of breath.   Cardiovascular:  Negative for chest pain.  Gastrointestinal:  Negative for abdominal pain, constipation, diarrhea, nausea and vomiting.  Genitourinary:  Negative for dysuria and hematuria.  Musculoskeletal:  Negative for back pain.  Skin:  Negative for color change and rash.  Neurological:  Positive for headaches. Negative for dizziness, light-headedness and numbness.  All other systems reviewed and are negative.  Physical Exam Updated Vital Signs BP (!) 134/114   Pulse 95   Temp 98.2 F (36.8 C) (Oral)   Resp 16   Ht 5\' 9"  (1.753 m)   Wt 60.3 kg   LMP 09/07/2020   SpO2 100%   BMI 19.63 kg/m   Physical Exam Vitals and  nursing note reviewed.  Constitutional:      General: She is not in acute distress.    Appearance: She is well-developed.  HENT:     Head: Normocephalic and atraumatic.     Nose:     Comments: Bilat nasal turbinates are swollen Eyes:     Conjunctiva/sclera: Conjunctivae normal.  Cardiovascular:     Rate and Rhythm: Normal rate and regular rhythm.     Heart sounds: No murmur heard. Pulmonary:     Effort: Pulmonary effort is normal. No respiratory distress.     Breath sounds: Normal breath sounds.  Abdominal:     Palpations: Abdomen is soft.     Tenderness: There is no abdominal tenderness.  Musculoskeletal:     Cervical back: Neck supple.  Skin:    General: Skin is warm and dry.  Neurological:     Mental Status: She is alert.     Comments: Mental Status:  Alert, thought content appropriate, able to give a coherent history. Speech fluent without evidence of aphasia. Able to follow 2 step commands without  difficulty.  Cranial Nerves II-XI intact Motor:  Normal tone. 5/5 strength of BUE and BLE major muscle groups including strong and equal grip strength and dorsiflexion/plantar flexion Sensory: light touch normal in all extremities.     ED Results / Procedures / Treatments   Labs (all labs ordered are listed, but only abnormal results are displayed) Labs Reviewed - No data to display  EKG None  Radiology No results found.  Procedures Procedures   Medications Ordered in ED Medications - No data to display  ED Course  I have reviewed the triage vital signs and the nursing notes.  Pertinent labs & imaging results that were available during my care of the patient were reviewed by me and considered in my medical decision making (see chart for details).    MDM Rules/Calculators/A&P                          25 year old female recently diagnosed with COVID and is here complaining of a headache.  No associated neurologic patients.  Neuro exam today is normal.  She also has significant nasal congestion with swollen nasal turbinates bilaterally.  I suspect that her headache is related to her sinuses and will prescribe fluticasone.  Advised Tylenol and Motrin for symptoms as well.  Discussed that we do not retest patients within 90 days of testing positive and that her sxs are likely related to her current covid infection. I have low suspicion for emergency neurologic cause of sxx. Advised symptomatic tx. Rx for fluticasone given. Advised on f/u and return precautions. She voices understanding of the plan and reasons to return. All questions answered, pt stable for discharge.   Judith Lewis was evaluated in Emergency Department on 09/21/2020 for the symptoms described in the history of present illness. She was evaluated in the context of the global COVID-19 pandemic, which necessitated consideration that the patient might be at risk for infection with the SARS-CoV-2 virus that causes COVID-19.  Institutional protocols and algorithms that pertain to the evaluation of patients at risk for COVID-19 are in a state of rapid change based on information released by regulatory bodies including the CDC and federal and state organizations. These policies and algorithms were followed during the patient's care in the ED.   Final Clinical Impression(s) / ED Diagnoses Final diagnoses:  COVID    Rx / DC Orders ED Discharge Orders  Ordered    fluticasone (FLONASE) 50 MCG/ACT nasal spray  Daily        09/21/20 1514             Rodney Booze, PA-C 09/21/20 1514    Tegeler, Gwenyth Allegra, MD 09/21/20 1752

## 2020-10-08 ENCOUNTER — Emergency Department (HOSPITAL_BASED_OUTPATIENT_CLINIC_OR_DEPARTMENT_OTHER): Payer: Medicaid Other

## 2020-10-08 ENCOUNTER — Emergency Department (HOSPITAL_BASED_OUTPATIENT_CLINIC_OR_DEPARTMENT_OTHER)
Admission: EM | Admit: 2020-10-08 | Discharge: 2020-10-08 | Disposition: A | Discharge status: Home / Self Care | Payer: Medicaid Other | Attending: Emergency Medicine | Admitting: Emergency Medicine

## 2020-10-08 ENCOUNTER — Encounter (HOSPITAL_BASED_OUTPATIENT_CLINIC_OR_DEPARTMENT_OTHER): Payer: Self-pay | Admitting: Emergency Medicine

## 2020-10-08 ENCOUNTER — Other Ambulatory Visit: Payer: Self-pay

## 2020-10-08 DIAGNOSIS — R000 Tachycardia, unspecified: Secondary | ICD-10-CM | POA: Diagnosis not present

## 2020-10-08 DIAGNOSIS — Z853 Personal history of malignant neoplasm of breast: Secondary | ICD-10-CM | POA: Insufficient documentation

## 2020-10-08 DIAGNOSIS — J45909 Unspecified asthma, uncomplicated: Secondary | ICD-10-CM | POA: Diagnosis not present

## 2020-10-08 DIAGNOSIS — R079 Chest pain, unspecified: Secondary | ICD-10-CM

## 2020-10-08 LAB — CBC WITH DIFFERENTIAL/PLATELET
Abs Immature Granulocytes: 0.01 10*3/uL (ref 0.00–0.07)
Basophils Absolute: 0 10*3/uL (ref 0.0–0.1)
Basophils Relative: 0 %
Eosinophils Absolute: 0.4 10*3/uL (ref 0.0–0.5)
Eosinophils Relative: 5 %
HCT: 40.7 % (ref 36.0–46.0)
Hemoglobin: 13.6 g/dL (ref 12.0–15.0)
Immature Granulocytes: 0 %
Lymphocytes Relative: 12 %
Lymphs Abs: 0.9 10*3/uL (ref 0.7–4.0)
MCH: 30.8 pg (ref 26.0–34.0)
MCHC: 33.4 g/dL (ref 30.0–36.0)
MCV: 92.3 fL (ref 80.0–100.0)
Monocytes Absolute: 0.7 10*3/uL (ref 0.1–1.0)
Monocytes Relative: 9 %
Neutro Abs: 5.9 10*3/uL (ref 1.7–7.7)
Neutrophils Relative %: 74 %
Platelets: 300 10*3/uL (ref 150–400)
RBC: 4.41 MIL/uL (ref 3.87–5.11)
RDW: 14 % (ref 11.5–15.5)
WBC: 7.9 10*3/uL (ref 4.0–10.5)
nRBC: 0 % (ref 0.0–0.2)

## 2020-10-08 LAB — BASIC METABOLIC PANEL
Anion gap: 8 (ref 5–15)
BUN: 11 mg/dL (ref 6–20)
CO2: 25 mmol/L (ref 22–32)
Calcium: 9.6 mg/dL (ref 8.9–10.3)
Chloride: 105 mmol/L (ref 98–111)
Creatinine, Ser: 0.72 mg/dL (ref 0.44–1.00)
GFR, Estimated: >60 mL/min (ref >60)
Glucose, Bld: 69 mg/dL — ABNORMAL LOW (ref 70–99)
Potassium: 3.5 mmol/L (ref 3.5–5.1)
Sodium: 138 mmol/L (ref 135–145)

## 2020-10-08 LAB — TROPONIN I (HIGH SENSITIVITY)
Troponin I (High Sensitivity): 16 ng/L (ref <18)
Troponin I (High Sensitivity): 18 ng/L — ABNORMAL HIGH (ref <18)

## 2020-10-08 LAB — D-DIMER, QUANTITATIVE: D-Dimer, Quant: 0.45 ug/mL-FEU (ref 0.00–0.50)

## 2020-10-08 NOTE — Discharge Instructions (Addendum)
It was a pleasure taking care of you today. As discussed, all of your labs were reassuring. You may take over the counter ibuprofen or tylenol as needed for you chest pain which is most likely muscular in nature. Please follow-up with PCP or oncologist within one week for further evaluation. Return to the ER for new or worsening symptoms.

## 2020-10-08 NOTE — ED Notes (Signed)
Pt refusing to be stuck for delta troponin; EDP notified.

## 2020-10-08 NOTE — ED Triage Notes (Signed)
Pt arrives EMS with c/o R side CP over mastectomy area. Pt endorses R side mastectomy, just completed radiation. Pt reports carrying heavy car seat recently, wants to verify that a pulled muscle.

## 2020-10-08 NOTE — ED Notes (Signed)
Pt now agrees to have blood drawn.

## 2020-10-08 NOTE — ED Provider Notes (Signed)
[facility] Provider Note   CSN: [id number] Arrival date & time: 10/08/20  1212     History Chief Complaint  Patient presents with   Chest Pain    Judith Lewis is a 25 y.o. female with a past medical history significant for asthma, history of breast cancer status post chemotherapy/radiation and right mastectomy who presents to the ED due to right-sided chest pain that radiates to her right arm x1 day.  Patient states pain is worse with stretching and movement.  No relation to exertion.  No pleuritic nature.  Patient denies fever or chills.  Patient states she was carrying a heavy car seat few days ago with her right arm.  Patient is currently on tamoxifen for breast cancer.  Denies history of blood clots, recent surgeries, recent long immobilizations.  Denies lower extremity edema.  No associated shortness of breath, nausea, vomiting, diaphoresis.  No history of MI or CVA. Denies tobacco use. No drug use.   History obtained from patient and past medical records. No interpreter used during encounter.    HPI: A 25 year old patient presents for evaluation of chest pain. Initial onset of pain was approximately 3-6 hours ago. The patient's chest pain is not worse with exertion. The patient's chest pain is not middle- or left-sided, is not well-localized, is not described as heaviness/pressure/tightness, is not sharp and does radiate to the arms/jaw/neck. The patient does not complain of nausea and denies diaphoresis. The patient has no history of stroke, has no history of peripheral artery disease, has not smoked in the past 90 days, denies any history of treated diabetes, has no relevant family history of coronary artery disease (first degree relative at less than age 50), is not hypertensive, has no history of hypercholesterolemia and does not have an elevated BMI (>=30).   Past Medical History:  Diagnosis Date   Asthma    Cancer [facility])    breast   Ectopic  pregnancy 2015    There are no problems to display for this patient.   Past Surgical History:  Procedure Laterality Date   BREAST SURGERY       OB History     Gravida  1   Para      Term      Preterm      AB      Living         SAB      IAB      Ectopic      Multiple      Live Births              History reviewed. No pertinent family history.  Social History   Tobacco Use   Smoking status: Never   Smokeless tobacco: Never  Substance Use Topics   Alcohol use: Never   Drug use: Never    Home Medications Prior to Admission medications   Medication Sig Start Date End Date Taking? Authorizing Provider  fluticasone (FLONASE) 50 MCG/ACT nasal spray Place 2 sprays into both nostrils daily. 09/21/20   Couture, Cortni S, PA-C  omeprazole (PRILOSEC) 20 MG capsule Take 1 capsule (20 mg total) by mouth daily for 14 days. 08/20/20 09/03/20  Caccavale, Sophia, PA-C  potassium chloride (KLOR-CON) 10 MEQ tablet Take 1 tablet (10 mEq total) by mouth daily. 08/20/20   Caccavale, Sophia, PA-C    Allergies    Patient has no known allergies.  Review of Systems   Review of Systems  Constitutional:  Negative  for chills and fever.  Respiratory:  Negative for cough and shortness of breath.   Cardiovascular:  Positive for chest pain. Negative for leg swelling.  All other systems reviewed and are negative.  Physical Exam Updated Vital Signs BP 105/78 (BP Location: Left Arm)   Pulse 77   Temp 98.5 F (36.9 C) (Oral)   Resp 15   LMP 10/04/2020   SpO2 100%   Physical Exam Vitals and nursing note reviewed.  Constitutional:      General: She is not in acute distress.    Appearance: She is not ill-appearing.  HENT:     Head: Normocephalic.  Eyes:     Pupils: Pupils are equal, round, and reactive to light.  Cardiovascular:     Rate and Rhythm: Regular rhythm. Tachycardia present.     Pulses: Normal pulses.     Heart sounds: Normal heart sounds. No murmur heard.    No friction rub. No gallop.  Pulmonary:     Effort: Pulmonary effort is normal.     Breath sounds: Normal breath sounds.  Chest:     Comments: No reproducible anterior chest wall tenderness.Old surgical scar to right breast.  No overlying erythema or drainage. Abdominal:     General: Abdomen is flat. There is no distension.     Palpations: Abdomen is soft.     Tenderness: There is no abdominal tenderness. There is no guarding or rebound.  Musculoskeletal:        General: Normal range of motion.     Cervical back: Neck supple.     Comments: No lower extremity edema.  Negative Homans' sign bilaterally.  Skin:    General: Skin is warm and dry.  Neurological:     General: No focal deficit present.     Mental Status: She is alert.  Psychiatric:        Mood and Affect: Mood normal.        Behavior: Behavior normal.    ED Results / Procedures / Treatments   Labs (all labs ordered are listed, but only abnormal results are displayed) Labs Reviewed  BASIC METABOLIC PANEL - Abnormal; Notable for the following components:      Result Value   Glucose, Bld 69 (*)    All other components within normal limits  TROPONIN I (HIGH SENSITIVITY) - Abnormal; Notable for the following components:   Troponin I (High Sensitivity) 18 (*)    All other components within normal limits  D-DIMER, QUANTITATIVE  CBC WITH DIFFERENTIAL/PLATELET  CBC WITH DIFFERENTIAL/PLATELET  PREGNANCY, URINE  TROPONIN I (HIGH SENSITIVITY)    EKG EKG Interpretation  Date/Time:  Sunday October 08 2020 12:28:35 EDT Ventricular Rate:  99 PR Interval:  146 QRS Duration: 87 QT Interval:  327 QTC Calculation: 420 R Axis:   40 Text Interpretation: Fast sinus arrhythmia RSR' in V1 or V2, probably normal variant Borderline T abnormalities, inferior leads similar to June 2022 Confirmed by Sherwood Gambler [phone]) on 10/08/2020 1:28:51 PM  Radiology DG Chest Portable 1 View  Result Date: 10/08/2020 CLINICAL DATA:  Right chest  pain after carrying a heavy car seat recently. EXAM: PORTABLE CHEST 1 VIEW COMPARISON:  09/16/2020 FINDINGS: Normal sized heart. Clear lungs. Stable postmastectomy changes on the right. Stable moderate thoracolumbar scoliosis. No fracture or pneumothorax seen. IMPRESSION: No acute abnormality. Electronically Signed   By: Claudie Revering M.D.   On: 10/08/2020 13:56    Procedures Procedures   Medications Ordered in ED Medications - No data to display  ED Course  I have reviewed the triage vital signs and the nursing notes.  Pertinent labs & imaging results that were available during my care of the patient were reviewed by me and considered in my medical decision making (see chart for details).  Clinical Course as of 10/08/20 1647  Sun Oct 08, 2020  1247 D-Dimer, Quant: 0.45 [CA]    Clinical Course User Index [CA] Suzy Bouchard, PA-C   MDM Rules/Calculators/A&P HEAR Score: 1                       25 year old female presents to the ED due to right-sided chest pain x1 day.  Patient has a history of breast cancer status post radiation/chemotherapy/mastectomy.  She is currently on hormones.  No history of blood clots, recent surgeries, recent long immobilizations.  Upon arrival, patient afebrile, tachycardic at 119 with otherwise reassuring vitals.  Patient's oxygen saturation at 100% on room air.  Benign physical exam.  Lungs clear to auscultation bilaterally.  No lower extremity edema.  Routine labs ordered.  Cardiac labs to rule out cardiac etiology. D-dimer to rule out PE given history of cancer and hormonal treatment.  CBC unremarkable no leukocytosis and normal hemoglobin.  BMP significant for mild hyperglycemia at 69.  Normal renal function.  No major electrolyte derangements.  D-dimer normal.  Low suspicion for PE/DVT.  Chest x-ray personally reviewed which is negative for signs of pneumonia, pneumothorax or widened mediastinum.  EKG demonstrates fast sinus arrhythmia with similar  appearance to previous EKGs.  No signs of acute ischemia.  2nd troponin mildly elevated; however patient continues to be chest pain free here in the ED. Patient's chest pain appears atypical for ACS. Discussed with Dr. Alvino Chapel about elevated troponin who notes patient is stable for discharge given low suspicion for ACS with normal EKG and atypical presentation. With normal d-dimer, low suspicion for PE/DVT. Patient's HR appears to elevated only when I walk in the room, suspect possible white coat syndrome. Presentation non-concerning for dissection. Possible MSK etiology. No infectious symptoms to suggest myocarditis. Advised patient to follow-up with PCP within the next week for further evaluation. Strict ED precautions discussed with patient. Patient states understanding and agrees to plan. Patient discharged home in no acute distress and stable vitals  Final Clinical Impression(s) / ED Diagnoses Final diagnoses:  Nonspecific chest pain    Rx / DC Orders ED Discharge Orders     None        Karie Kirks 10/08/20 1649    Davonna Belling, MD 10/08/20 2218

## 2020-10-10 ENCOUNTER — Encounter (HOSPITAL_BASED_OUTPATIENT_CLINIC_OR_DEPARTMENT_OTHER): Payer: Self-pay

## 2020-10-10 ENCOUNTER — Emergency Department (HOSPITAL_BASED_OUTPATIENT_CLINIC_OR_DEPARTMENT_OTHER)
Admission: EM | Admit: 2020-10-10 | Discharge: 2020-10-10 | Disposition: A | Discharge status: Home / Self Care | Payer: Medicaid Other | Attending: Emergency Medicine | Admitting: Emergency Medicine

## 2020-10-10 ENCOUNTER — Other Ambulatory Visit: Payer: Self-pay

## 2020-10-10 DIAGNOSIS — J349 Unspecified disorder of nose and nasal sinuses: Secondary | ICD-10-CM

## 2020-10-10 DIAGNOSIS — U071 COVID-19: Secondary | ICD-10-CM | POA: Diagnosis not present

## 2020-10-10 DIAGNOSIS — Z853 Personal history of malignant neoplasm of breast: Secondary | ICD-10-CM | POA: Insufficient documentation

## 2020-10-10 DIAGNOSIS — Z7951 Long term (current) use of inhaled steroids: Secondary | ICD-10-CM | POA: Diagnosis not present

## 2020-10-10 DIAGNOSIS — J343 Hypertrophy of nasal turbinates: Secondary | ICD-10-CM | POA: Diagnosis not present

## 2020-10-10 DIAGNOSIS — R000 Tachycardia, unspecified: Secondary | ICD-10-CM | POA: Insufficient documentation

## 2020-10-10 DIAGNOSIS — J45909 Unspecified asthma, uncomplicated: Secondary | ICD-10-CM | POA: Insufficient documentation

## 2020-10-10 DIAGNOSIS — R0981 Nasal congestion: Secondary | ICD-10-CM | POA: Diagnosis present

## 2020-10-10 MED ORDER — FLUTICASONE PROPIONATE 50 MCG/ACT NA SUSP: 1.0000 | Freq: Every day | NASAL | 2 refills | Status: AC

## 2020-10-10 MED ORDER — CETIRIZINE HCL 10 MG PO TABS: 10.0000 mg | ORAL_TABLET | Freq: Every day | ORAL | 0 refills | Status: AC

## 2020-10-10 NOTE — ED Triage Notes (Signed)
Pt c/o head pressure yesterday. States felt like it was going to start again today. Denies headache at arrival. Denies fever/vision changes/N/V.

## 2020-10-10 NOTE — Discharge Instructions (Addendum)
Please pick up medications and take as prescribed daily to help with your sinus disease/nasal swelling.   Follow up with your oncologist regarding ED visit today and to see how you do without taking the Tamoxifen.   Return to the ED for any new/worsening symptoms including worsening headaches, vision changes, nausea, vomiting, confusion, unilateral weakness/numbness, speech changes.

## 2020-10-10 NOTE — ED Provider Notes (Signed)
[place] EMERGENCY DEPARTMENT Provider Note   CSN: [id number] Arrival date & time: 10/10/20  4098     History Chief Complaint  Patient presents with   Facial Pain    Sinus Pressure    Judith Lewis is a 25 y.o. female who presents to the ED today with complaint of gradual onset, intermittent, diffuse facial pain/headache.  She reports she had COVID about 2 weeks ago.  She had similar symptoms at that time and was seen in the ED for same.  She had a negative CT scan at that time and then tested positive for COVID.  She states she had another episode and she was seen in the ED again.  It was deduced that her symptoms are likely related to her active COVID infection and she was discharged home at that time.  She states that yesterday she had another episode of pressure in her head.  She states it lasted about 3 hours.  She took Tylenol with relief.  She states that this morning she felt like the symptoms were going to happen again however they did not.  She states she came to the ED today for further evaluation.  Patient does have a history of breast cancer status post chemo and radiation and right mastectomy.  She follows with oncology.  She was recently started on tamoxifen as well on 07/02 and called oncology to reduce if this was causing her headaches.  She was advised to stop taking the tamoxifen for now until further evaluation.   Per chart review patient was seen in the ED initially on 06/18 for head pressure and lightheadedness.  She had a CT scan which did not show any acute intracranial abnormality however did show pansinus disease.  She tested positive for COVID at that time.  Appears her symptoms have been ongoing for 2 weeks at that time and she was not a candidate for oral antivirals.  She returned to the ED on 06/23 with continued complaint of head pressure.  She was noted to have bilateral swelling to turbinates and was advised to take Flonase to help.  It was deduced that  her symptoms were likely related to active COVID infection versus sinus issues.  She states she took the [facility] for about 4 days and did not have any symptoms then however stopped taking it.    Patient denies any recent fevers, chills, blurry vision, double vision, confusion, nausea, vomiting, lightheadedness, dizziness, weakness, numbness, any other associated symptoms.  The history is provided by the patient and medical records.      Past Medical History:  Diagnosis Date   Asthma    Cancer [facility])    breast   Ectopic pregnancy 2015    There are no problems to display for this patient.   Past Surgical History:  Procedure Laterality Date   BREAST SURGERY       OB History     Gravida  1   Para      Term      Preterm      AB      Living         SAB      IAB      Ectopic      Multiple      Live Births              History reviewed. No pertinent family history.  Social History   Tobacco Use   Smoking status: Never   Smokeless  tobacco: Never  Substance Use Topics   Alcohol use: Never   Drug use: Never    Home Medications Prior to Admission medications   Medication Sig Start Date End Date Taking? Authorizing Provider  cetirizine (ZYRTEC ALLERGY) 10 MG tablet Take 1 tablet (10 mg total) by mouth daily. 10/10/20 11/09/20 Yes Renji Berwick, PA-C  fluticasone (FLONASE) 50 MCG/ACT nasal spray Place 1 spray into both nostrils daily. 10/10/20 11/09/20 Yes Jazir Newey, PA-C  fluticasone (FLONASE) 50 MCG/ACT nasal spray Place 2 sprays into both nostrils daily. 09/21/20   Couture, Cortni S, PA-C  omeprazole (PRILOSEC) 20 MG capsule Take 1 capsule (20 mg total) by mouth daily for 14 days. 08/20/20 09/03/20  Caccavale, Sophia, PA-C  potassium chloride (KLOR-CON) 10 MEQ tablet Take 1 tablet (10 mEq total) by mouth daily. 08/20/20   Caccavale, Sophia, PA-C    Allergies    Patient has no known allergies.  Review of Systems   Review of Systems  Constitutional:   Negative for chills and fever.  HENT:  Positive for sinus pressure.   Eyes:  Negative for visual disturbance.  Gastrointestinal:  Negative for nausea and vomiting.  Neurological:  Positive for headaches. Negative for dizziness, syncope, facial asymmetry, weakness, light-headedness and numbness.  All other systems reviewed and are negative.  Physical Exam Updated Vital Signs BP 123/85 (BP Location: Left Arm)   Pulse (!) 115   Temp 98.4 F (36.9 C) (Oral)   Resp 18   Ht 5\' 9"  (1.753 m)   Wt 60.3 kg   LMP 10/04/2020   SpO2 100%   BMI 19.64 kg/m   Physical Exam Vitals and nursing note reviewed.  Constitutional:      Appearance: She is not ill-appearing.  HENT:     Head: Normocephalic and atraumatic.     Nose:     Right Turbinates: Swollen.     Left Turbinates: Swollen.     Right Sinus: No maxillary sinus tenderness or frontal sinus tenderness.     Left Sinus: No maxillary sinus tenderness or frontal sinus tenderness.  Eyes:     Conjunctiva/sclera: Conjunctivae normal.  Cardiovascular:     Rate and Rhythm: Normal rate and regular rhythm.  Pulmonary:     Effort: Pulmonary effort is normal.     Breath sounds: Normal breath sounds.  Skin:    General: Skin is warm and dry.     Coloration: Skin is not jaundiced.  Neurological:     Mental Status: She is alert.     Comments: Alert and oriented to self, place, time and event.   Speech is fluent, clear without dysarthria or dysphasia.   Strength 5/5 in upper/lower extremities   Sensation intact in upper/lower extremities   Normal gait.  Negative Romberg. No pronator drift.  Normal finger-to-nose and feet tapping.  CN I not tested  CN II grossly intact visual fields bilaterally. Did not visualize posterior eye.  CN III, IV, VI PERRLA and EOMs intact bilaterally  CN V Intact sensation to sharp and light touch to the face  CN VII facial movements symmetric  CN VIII not tested  CN IX, X no uvula deviation, symmetric rise of  soft palate  CN XI 5/5 SCM and trapezius strength bilaterally  CN XII Midline tongue protrusion, symmetric L/R movements      ED Results / Procedures / Treatments   Labs (all labs ordered are listed, but only abnormal results are displayed) Labs Reviewed - No data to display  EKG None  Radiology DG Chest Portable 1 View  Result Date: 10/08/2020 CLINICAL DATA:  Right chest pain after carrying a heavy car seat recently. EXAM: PORTABLE CHEST 1 VIEW COMPARISON:  09/16/2020 FINDINGS: Normal sized heart. Clear lungs. Stable postmastectomy changes on the right. Stable moderate thoracolumbar scoliosis. No fracture or pneumothorax seen. IMPRESSION: No acute abnormality. Electronically Signed   By: Claudie Revering M.D.   On: 10/08/2020 13:56    Procedures Procedures   Medications Ordered in ED Medications - No data to display  ED Course  I have reviewed the triage vital signs and the nursing notes.  Pertinent labs & imaging results that were available during my care of the patient were reviewed by me and considered in my medical decision making (see chart for details).    MDM Rules/Calculators/A&P                          25 year old female presents to the ED today with intermittent headache/sinus pressure for the last 2 weeks.  Recently had COVID as well.  Was seen in the ED twice for same, CT scan showed pansinus disease and additional ED visit showed bilateral nasal turbinate edema.  Was prescribed Flonase with good relief for 4 days however stopped taking it.  Presents to the ED today again for further evaluation  On arrival to the ED patient is afebrile.  She is mildly tachycardic at 115 on arrival however this is dissipated while in the room.  She is nontachypneic, nontoxic-appearing.  On my exam she is still noted to have bilateral nasal turbinate edema.  She has no focal neurodeficits on exam and no meningeal signs.  She denies any active headache at this time.  I do not feel she needs  repeat CT scan at this time as I have very low suspicion for acute intracranial abnormality today.  I do suspect that her sinus pressure/pansinus disease seen on CT scan previously is likely causing her symptoms.  She was also advised to stop taking tamoxifen yesterday by her oncology to see if this was relating to her headaches.  We will plan to represcribe Flonase as well as daily Zyrtec for patient.  Advised to continue daily even if she is not having symptoms for event repeat headaches.  Advised to follow-up with oncology for same.  Patient is in agreement with plan and stable for discharge.   This note was prepared using Dragon voice recognition software and may include unintentional dictation errors due to the inherent limitations of voice recognition software.   Final Clinical Impression(s) / ED Diagnoses Final diagnoses:  Nasal turbinate hypertrophy  Paranasal sinus disease    Rx / DC Orders ED Discharge Orders          Ordered    fluticasone (FLONASE) 50 MCG/ACT nasal spray  Daily        10/10/20 1059    cetirizine (ZYRTEC ALLERGY) 10 MG tablet  Daily        10/10/20 1059             Discharge Instructions      Please pick up medications and take as prescribed daily to help with your sinus disease/nasal swelling.   Follow up with your oncologist regarding ED visit today and to see how you do without taking the Tamoxifen.   Return to the ED for any new/worsening symptoms including worsening headaches, vision changes, nausea, vomiting, confusion, unilateral weakness/numbness, speech changes.  Eustaquio Maize, PA-C 10/10/20 1100    Truddie Hidden, MD 10/10/20 1426

## 2020-10-29 ENCOUNTER — Other Ambulatory Visit: Payer: Self-pay

## 2020-10-29 ENCOUNTER — Emergency Department (HOSPITAL_BASED_OUTPATIENT_CLINIC_OR_DEPARTMENT_OTHER)
Admission: EM | Admit: 2020-10-29 | Discharge: 2020-10-29 | Disposition: A | Discharge status: Home / Self Care | Payer: Medicaid Other | Attending: Emergency Medicine | Admitting: Emergency Medicine

## 2020-10-29 ENCOUNTER — Encounter (HOSPITAL_BASED_OUTPATIENT_CLINIC_OR_DEPARTMENT_OTHER): Payer: Self-pay | Admitting: Emergency Medicine

## 2020-10-29 DIAGNOSIS — Z5321 Procedure and treatment not carried out due to patient leaving prior to being seen by health care provider: Secondary | ICD-10-CM | POA: Diagnosis not present

## 2020-10-29 DIAGNOSIS — M79604 Pain in right leg: Secondary | ICD-10-CM | POA: Insufficient documentation

## 2020-10-29 DIAGNOSIS — M79605 Pain in left leg: Secondary | ICD-10-CM | POA: Insufficient documentation

## 2020-10-29 NOTE — ED Triage Notes (Signed)
Pt arrives pov with c/o bilateral leg pain x 3 days. Pt denies injury, denies travel

## 2020-11-02 ENCOUNTER — Other Ambulatory Visit: Payer: Self-pay

## 2020-11-02 ENCOUNTER — Encounter (HOSPITAL_BASED_OUTPATIENT_CLINIC_OR_DEPARTMENT_OTHER): Payer: Self-pay | Admitting: *Deleted

## 2020-11-02 ENCOUNTER — Emergency Department (HOSPITAL_BASED_OUTPATIENT_CLINIC_OR_DEPARTMENT_OTHER)
Admission: EM | Admit: 2020-11-02 | Discharge: 2020-11-02 | Disposition: A | Discharge status: Home / Self Care | Payer: Medicaid Other | Attending: Emergency Medicine | Admitting: Emergency Medicine

## 2020-11-02 DIAGNOSIS — Z853 Personal history of malignant neoplasm of breast: Secondary | ICD-10-CM | POA: Diagnosis not present

## 2020-11-02 DIAGNOSIS — E860 Dehydration: Secondary | ICD-10-CM | POA: Diagnosis not present

## 2020-11-02 DIAGNOSIS — M79605 Pain in left leg: Secondary | ICD-10-CM | POA: Diagnosis not present

## 2020-11-02 DIAGNOSIS — R252 Cramp and spasm: Secondary | ICD-10-CM | POA: Insufficient documentation

## 2020-11-02 DIAGNOSIS — R000 Tachycardia, unspecified: Secondary | ICD-10-CM | POA: Insufficient documentation

## 2020-11-02 DIAGNOSIS — M79604 Pain in right leg: Secondary | ICD-10-CM | POA: Diagnosis not present

## 2020-11-02 DIAGNOSIS — M25562 Pain in left knee: Secondary | ICD-10-CM | POA: Insufficient documentation

## 2020-11-02 DIAGNOSIS — M25561 Pain in right knee: Secondary | ICD-10-CM | POA: Diagnosis not present

## 2020-11-02 DIAGNOSIS — J45909 Unspecified asthma, uncomplicated: Secondary | ICD-10-CM | POA: Insufficient documentation

## 2020-11-02 LAB — PREGNANCY, URINE: Preg Test, Ur: NEGATIVE

## 2020-11-02 LAB — CBC WITH DIFFERENTIAL/PLATELET
Abs Immature Granulocytes: 0.01 10*3/uL (ref 0.00–0.07)
Basophils Absolute: 0.1 10*3/uL (ref 0.0–0.1)
Basophils Relative: 1 %
Eosinophils Absolute: 0.6 10*3/uL — ABNORMAL HIGH (ref 0.0–0.5)
Eosinophils Relative: 7 %
HCT: 44.8 % (ref 36.0–46.0)
Hemoglobin: 15.5 g/dL — ABNORMAL HIGH (ref 12.0–15.0)
Immature Granulocytes: 0 %
Lymphocytes Relative: 17 %
Lymphs Abs: 1.3 10*3/uL (ref 0.7–4.0)
MCH: 31.8 pg (ref 26.0–34.0)
MCHC: 34.6 g/dL (ref 30.0–36.0)
MCV: 91.8 fL (ref 80.0–100.0)
Monocytes Absolute: 0.6 10*3/uL (ref 0.1–1.0)
Monocytes Relative: 8 %
Neutro Abs: 5 10*3/uL (ref 1.7–7.7)
Neutrophils Relative %: 67 %
Platelets: 316 10*3/uL (ref 150–400)
RBC: 4.88 MIL/uL (ref 3.87–5.11)
RDW: 13.5 % (ref 11.5–15.5)
WBC: 7.5 10*3/uL (ref 4.0–10.5)
nRBC: 0 % (ref 0.0–0.2)

## 2020-11-02 LAB — COMPREHENSIVE METABOLIC PANEL
ALT: 26 U/L (ref 0–44)
AST: 23 U/L (ref 15–41)
Albumin: 4.4 g/dL (ref 3.5–5.0)
Alkaline Phosphatase: 96 U/L (ref 38–126)
Anion gap: 10 (ref 5–15)
BUN: 17 mg/dL (ref 6–20)
CO2: 23 mmol/L (ref 22–32)
Calcium: 9.6 mg/dL (ref 8.9–10.3)
Chloride: 104 mmol/L (ref 98–111)
Creatinine, Ser: 0.81 mg/dL (ref 0.44–1.00)
GFR, Estimated: >60 mL/min (ref >60)
Glucose, Bld: 93 mg/dL (ref 70–99)
Potassium: 3.6 mmol/L (ref 3.5–5.1)
Sodium: 137 mmol/L (ref 135–145)
Total Bilirubin: 1.1 mg/dL (ref 0.3–1.2)
Total Protein: 8.2 g/dL — ABNORMAL HIGH (ref 6.5–8.1)

## 2020-11-02 LAB — HIV ANTIBODY (ROUTINE TESTING W REFLEX): HIV Screen 4th Generation wRfx: NONREACTIVE

## 2020-11-02 LAB — LIPASE, BLOOD: Lipase: 27 U/L (ref 11–51)

## 2020-11-02 LAB — CK: Total CK: 70 U/L (ref 38–234)

## 2020-11-02 MED ORDER — SODIUM CHLORIDE 0.9 % IV BOLUS
1000.0000 mL | Freq: Once | INTRAVENOUS | Status: AC
  Administered 2020-11-02: 1000 mL via INTRAVENOUS

## 2020-11-02 NOTE — ED Provider Notes (Signed)
Emergency Department Provider Note   I have reviewed the triage vital signs and the nursing notes.   HISTORY  Chief Complaint Leg Pain   HPI Judith Lewis is a 25 y.o. female with past medical history of active breast cancer and asthma presents to the emergency department with bilateral leg pain.  She feels some cramping in her muscles.  She is having intermittent sharp pains over the anterior knees both left and right.  No leg swelling or calf/popliteal fossa pain.  Some cramping into her thighs bilaterally.  Patient arrives having some tachycardia.  She denies shortness of breath or chest pain.  She is not having near syncope type symptoms.  She feels that she is reasonably well-hydrated.  She has not had chemotherapy since March and has completed radiation.  She has a secondary concern of possible STD exposure but is not having symptoms.  We discussed various options and she is opting for urine gonorrhea/chlamydia testing along with HIV testing.   Past Medical History:  Diagnosis Date   Asthma    Cancer [facility])    breast   Ectopic pregnancy 2015    There are no problems to display for this patient.   Past Surgical History:  Procedure Laterality Date   BREAST SURGERY      Allergies Patient has no known allergies.  No family history on file.  Social History Social History   Tobacco Use   Smoking status: Never   Smokeless tobacco: Never  Vaping Use   Vaping Use: Never used  Substance Use Topics   Alcohol use: Never   Drug use: Never    Review of Systems  Constitutional: No fever/chills Eyes: No visual changes. ENT: No sore throat. Cardiovascular: Denies chest pain.  Respiratory: Denies shortness of breath. Gastrointestinal: No abdominal pain.  No nausea, no vomiting.  No diarrhea.  No constipation. Genitourinary: Negative for dysuria. Musculoskeletal: Negative for back pain. Positive cramping pain in legs.  Skin: Negative for rash. Neurological: Negative  for headaches, focal weakness or numbness.  10-point ROS otherwise negative.  ____________________________________________   PHYSICAL EXAM:  VITAL SIGNS: ED Triage Vitals  Enc Vitals Group     BP 11/02/20 1454 101/79     Pulse Rate 11/02/20 1454 (!) 135     Resp 11/02/20 1454 20     Temp 11/02/20 1454 98.4 F (36.9 C)     Temp Source 11/02/20 1454 Oral     SpO2 11/02/20 1454 100 %     Weight 11/02/20 1449 134 lb 14.7 oz (61.2 kg)     Height 11/02/20 1449 '5\' 9"'$  (1.753 m)   Constitutional: Alert and oriented. Well appearing and in no acute distress. Eyes: Conjunctivae are normal.  Head: Atraumatic. Nose: No congestion/rhinnorhea. Mouth/Throat: Mucous membranes are slightly dry.  Neck: No stridor.  Cardiovascular: Tachycardia. Good peripheral circulation. Grossly normal heart sounds.   Respiratory: Normal respiratory effort.  No retractions. Lungs CTAB. Gastrointestinal: Soft and nontender. No distention.  Musculoskeletal: No lower extremity tenderness nor edema. No gross deformities of extremities. No calf or popliteal tenderness.  Neurologic:  Normal speech and language. No gross focal neurologic deficits are appreciated.  Skin:  Skin is warm, dry and intact. No rash noted.  ____________________________________________   LABS (all labs ordered are listed, but only abnormal results are displayed)  Labs Reviewed  COMPREHENSIVE METABOLIC PANEL - Abnormal; Notable for the following components:      Result Value   Total Protein 8.2 (*)    All  other components within normal limits  CBC WITH DIFFERENTIAL/PLATELET - Abnormal; Notable for the following components:   Hemoglobin 15.5 (*)    Eosinophils Absolute 0.6 (*)    All other components within normal limits  LIPASE, BLOOD  PREGNANCY, URINE  CK  HIV ANTIBODY (ROUTINE TESTING W REFLEX)  GC/CHLAMYDIA PROBE AMP ([place]) NOT AT [facility]   ____________________________________________  EKG   EKG  Interpretation  Date/Time:  Thursday November 02 2020 15:03:04 EDT Ventricular Rate:  126 PR Interval:  63 QRS Duration: 81 QT Interval:  300 QTC Calculation: 453 R Axis:   -21 Text Interpretation: Sinus tachycardia LAE, consider biatrial enlargement Borderline left axis deviation RSR' in V1 or V2, probably normal variant Borderline T wave abnormalities Similar to July 2022 tracing Confirmed by Nanda Quinton [phone]) on 11/02/2020 3:21:46 PM        ____________________________________________  RADIOLOGY  None   ____________________________________________   PROCEDURES  Procedure(s) performed:   Procedures  None  ____________________________________________   INITIAL IMPRESSION / ASSESSMENT AND PLAN / ED COURSE  Pertinent labs & imaging results that were available during my care of the patient were reviewed by me and considered in my medical decision making (see chart for details).   Patient presents emergency department with mild tachycardia along with cramping in the muscles.  Exam is not consistent with DVT.  Her clinical history does not raise suspicion for PE.  Her tachycardia seems similar to prior EKG tracings from July.  She does seem slightly dehydrated.  We will give IV fluids and check labs including CK.  She would like routine STD screening.  We discussed various screening options and she has opted for the urine gonorrhea/chlamydia test.  She is not having lower abdominal pain or discharge.  We will also test for HIV but no direct, known exposures.   Labs reviewed and reassuring. Pregnancy is negative. No leukocytosis. Normal lipase and CK. No electrolyte abnormality. Patient feeling better after IVF. Up and ambulatory without difficulty. Will follow STD test results in the MyChart app. Plan for discharge with plan for PCP and Oncology follow up.  ____________________________________________  FINAL CLINICAL IMPRESSION(S) / ED DIAGNOSES  Final diagnoses:  Dehydration   Muscle cramping    MEDICATIONS GIVEN DURING THIS VISIT:  Medications  sodium chloride 0.9 % bolus 1,000 mL (0 mLs Intravenous Stopped 11/02/20 1635)    Note:  This document was prepared using Dragon voice recognition software and may include unintentional dictation errors.  Nanda Quinton, MD, [facility]    Kaiyana Bedore, [facility], MD 11/02/20 [phone]

## 2020-11-02 NOTE — ED Notes (Signed)
Pt able to walk to [place] to get urine  After  Being dizzy the first time I ask her to get one

## 2020-11-02 NOTE — ED Triage Notes (Addendum)
Pain in both legs and knees for a week. No injury. Heart rate noted 135. EKG ordered.

## 2020-11-02 NOTE — Discharge Instructions (Addendum)
You were seen in the emerge department today with leg cramping.  I suspect you are dehydrated.  Please drink plenty of fluids and follow closely with your primary care doctor.  If your symptoms continue please discuss further with your primary care as well as your oncology team.

## 2020-11-02 NOTE — ED Notes (Signed)
Pt states has  Just finished chemo in march and radiation for breast cancer after, states has had bilateral leg nd knee pain , also wants to be checked for STD and HIV

## 2020-11-03 LAB — GC/CHLAMYDIA PROBE AMP (~~LOC~~) NOT AT ARMC
Chlamydia: NEGATIVE
Comment: NEGATIVE
Comment: NORMAL
Neisseria Gonorrhea: NEGATIVE

## 2021-10-20 IMAGING — CT CT HEAD W/O CM
3 series · 16 of 45 positions shown, 19 images · non-contrast
Comparison: None.

CLINICAL DATA: Lightheadedness, head pressure

EXAM:
CT HEAD WITHOUT CONTRAST
TECHNIQUE: Contiguous axial images were obtained from the base of the skull
through the vertex without intravenous contrast.

[Series 2: head wo · axial · 0.41mm/px · z∈[+1052,+1168]mm · 10 of 28 slices shown, 13 images]
[im 3/28  brain]
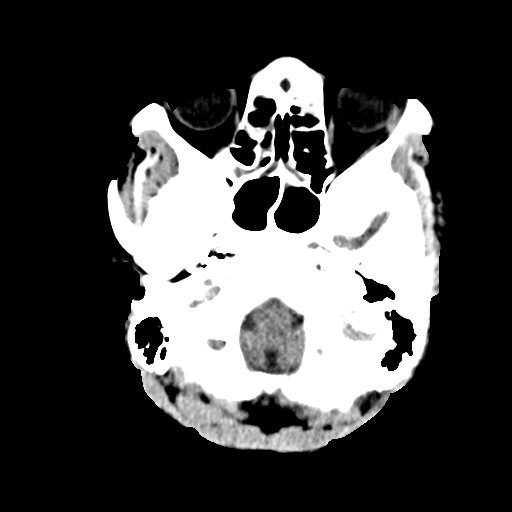
[im 3/28  bone]
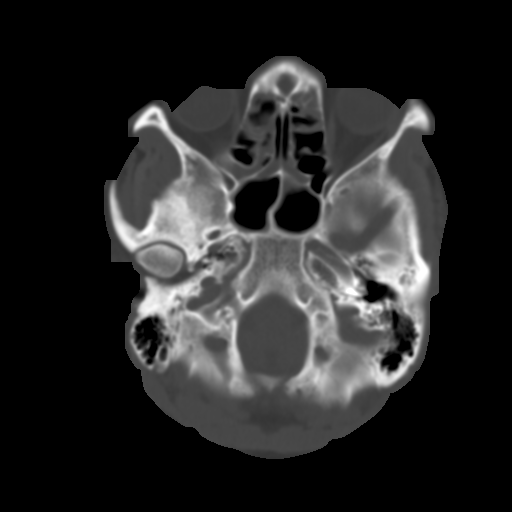
[im 5/28  brain]
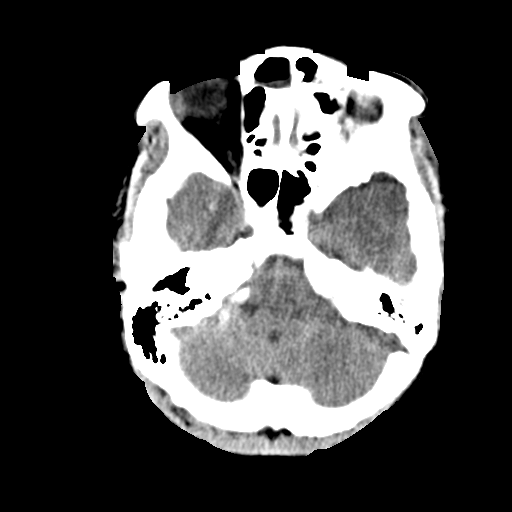
[im 8/28  brain]
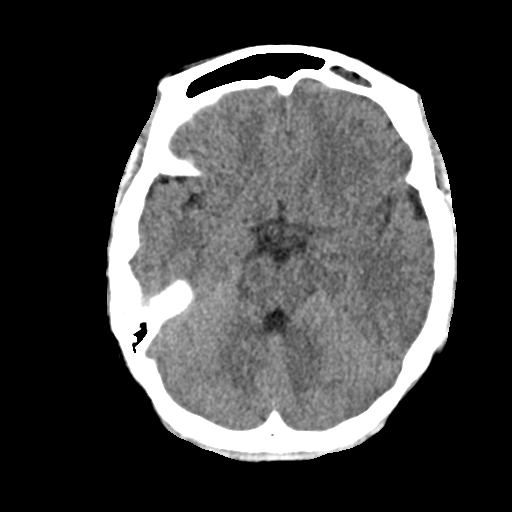
[im 11/28  brain]
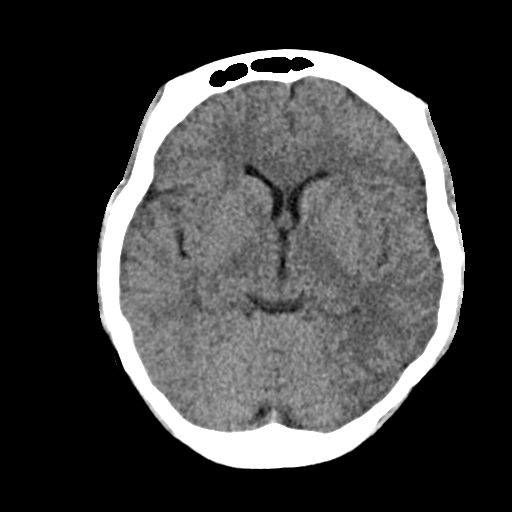
[im 13/28  brain]
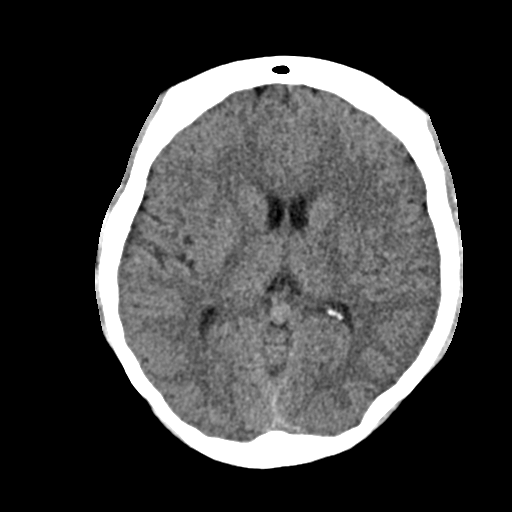
[im 13/28  bone]
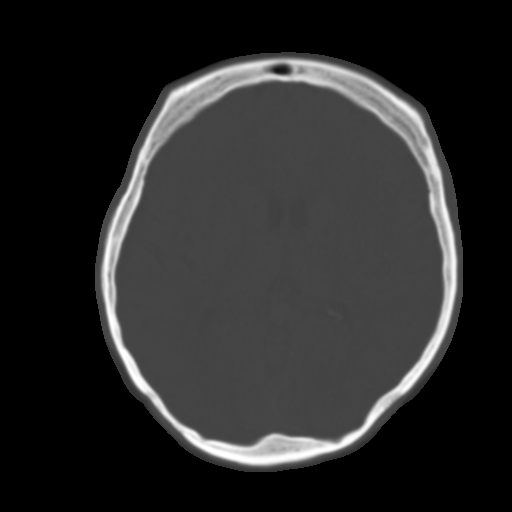
[im 16/28  brain]
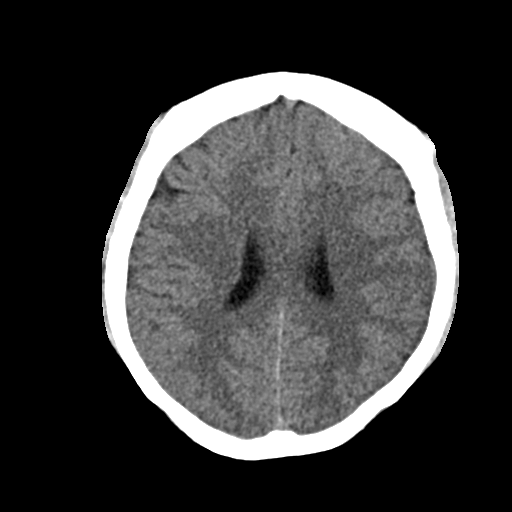
[im 18/28  brain]
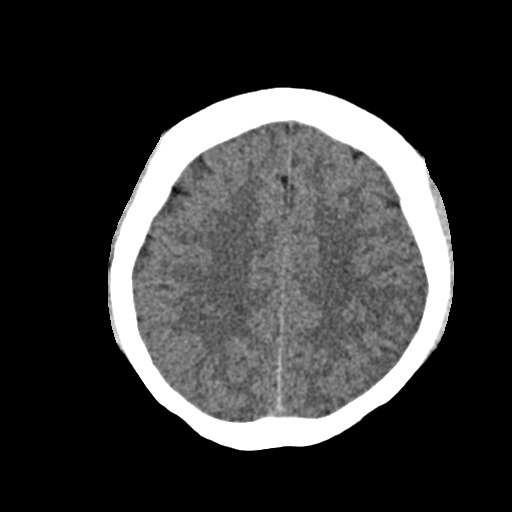
[im 21/28  brain]
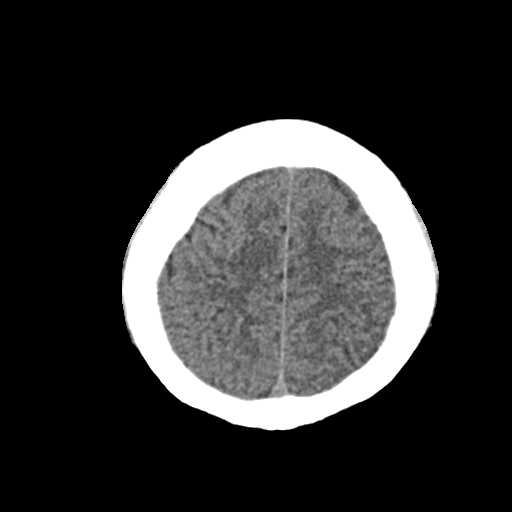
[im 24/28  brain]
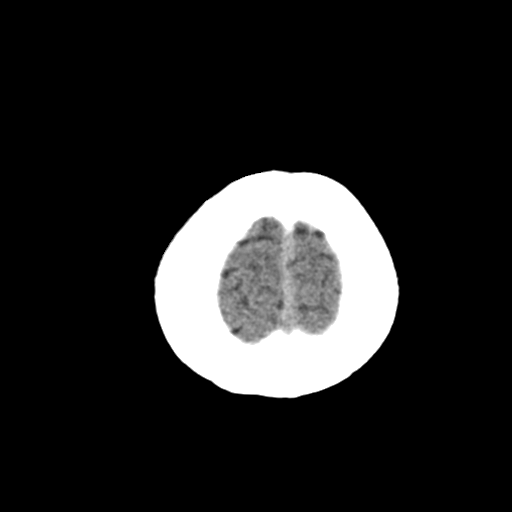
[im 24/28  bone]
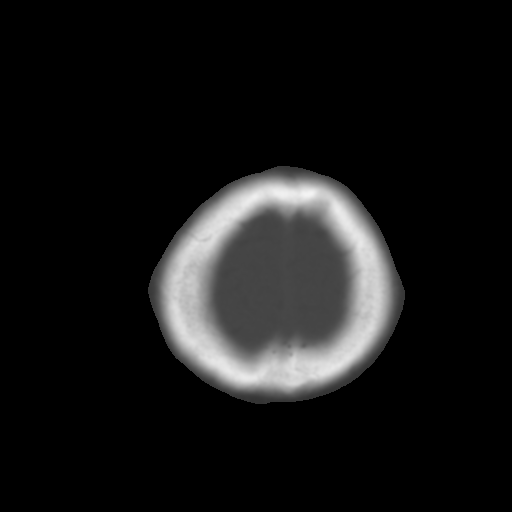
[im 26/28  brain]
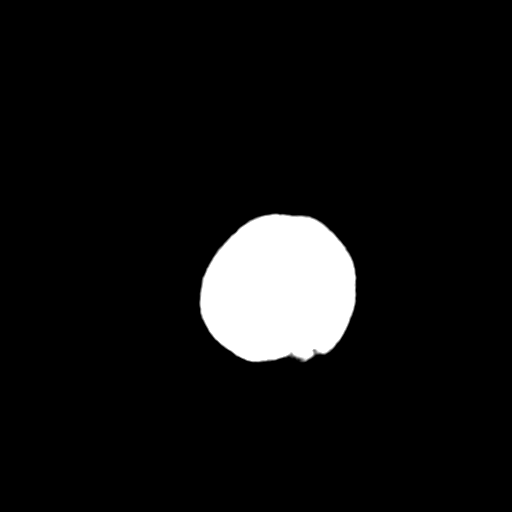

[Series 4: coronal soft · coronal · 0.39mm/px · 3 of 63 slices shown]
[im 21/63  brain]
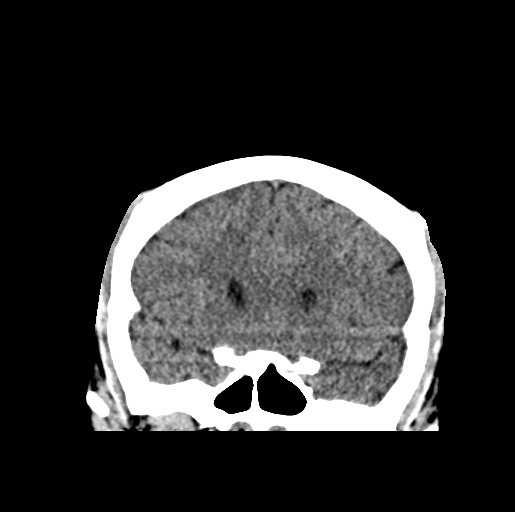
[im 28/63  brain]
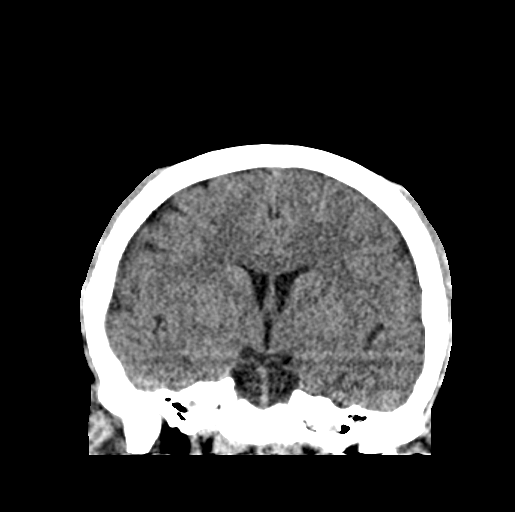
[im 35/63  brain]
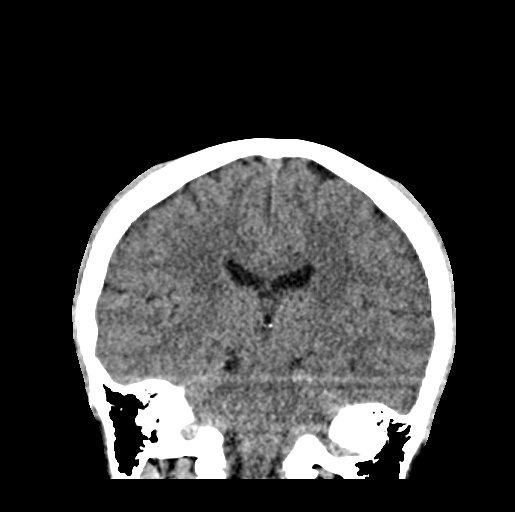

[Series 5: sag soft · sagittal · 0.41mm/px · 3 of 63 slices shown]
[im 21/63  brain]
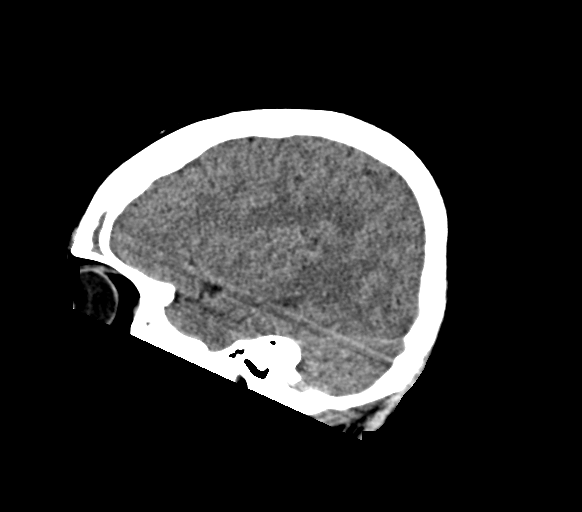
[im 32/63  brain]
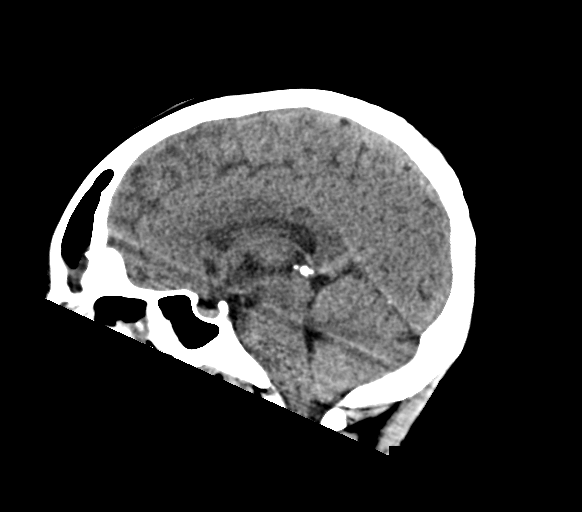
[im 42/63  brain]
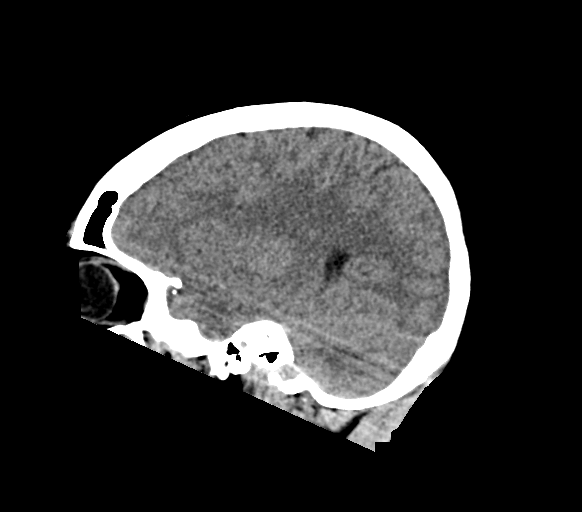

[16 of 45 positions shown; findings below may reference images not displayed]

FINDINGS: Brain: No acute infarct or hemorrhage. Lateral ventricles and
midline structures are unremarkable. No acute extra-axial fluid
collections. No mass effect.

Vascular: No hyperdense vessel or unexpected calcification.

Skull: Normal. Negative for fracture or focal lesion.

Sinuses/Orbits: Mucosal thickening throughout the paranasal sinuses,
with near complete opacification of the left frontal sinus, anterior
ethmoid air cells, and right maxillary sinus.

Other: None.
IMPRESSION: 1. No acute intracranial process.
2. Pansinus disease.

## 2024-03-15 ENCOUNTER — Emergency Department (HOSPITAL_BASED_OUTPATIENT_CLINIC_OR_DEPARTMENT_OTHER)
Admission: EM | Admit: 2024-03-15 | Discharge: 2024-03-15 | Disposition: A | Discharge status: Home / Self Care | Attending: Emergency Medicine | Admitting: Emergency Medicine

## 2024-03-15 ENCOUNTER — Encounter (HOSPITAL_BASED_OUTPATIENT_CLINIC_OR_DEPARTMENT_OTHER): Payer: Self-pay | Admitting: Emergency Medicine

## 2024-03-15 ENCOUNTER — Other Ambulatory Visit: Payer: Self-pay

## 2024-03-15 DIAGNOSIS — R059 Cough, unspecified: Secondary | ICD-10-CM | POA: Diagnosis present

## 2024-03-15 DIAGNOSIS — J101 Influenza due to other identified influenza virus with other respiratory manifestations: Secondary | ICD-10-CM | POA: Diagnosis not present

## 2024-03-15 DIAGNOSIS — J45909 Unspecified asthma, uncomplicated: Secondary | ICD-10-CM | POA: Diagnosis not present

## 2024-03-15 DIAGNOSIS — J111 Influenza due to unidentified influenza virus with other respiratory manifestations: Secondary | ICD-10-CM

## 2024-03-15 DIAGNOSIS — Z7951 Long term (current) use of inhaled steroids: Secondary | ICD-10-CM | POA: Diagnosis not present

## 2024-03-15 LAB — RESP PANEL BY RT-PCR (RSV, FLU A&B, COVID)  RVPGX2
Influenza A by PCR: POSITIVE — AB
Influenza B by PCR: NEGATIVE
Resp Syncytial Virus by PCR: NEGATIVE
SARS Coronavirus 2 by RT PCR: NEGATIVE

## 2024-03-15 NOTE — ED Notes (Signed)
 Discharge instructions reviewed with patient. Patient verbalizes understanding, no further questions at this time. Medications and follow up information provided. No acute distress noted at time of departure.

## 2024-03-15 NOTE — ED Provider Notes (Signed)
 [place] EMERGENCY DEPARTMENT AT [facility] Provider Note   CSN: [id number] Arrival date & time: 03/15/24  1119     Patient presents with: URI   Judith Lewis is a 28 y.o. female.   Patient is here for evaluation of cough, congestion, fatigue, body aches x 1 day.  Patient is also here with her 3 young children who are also being evaluated for similar symptoms.  Patient last took Tylenol at 8 AM this morning.  Has not had any recorded fevers at home but felt hot so she took this medication.  She has also taken over-the-counter flu medication. Denies shortness of breath, chest pain, N/V/D.  Patient notes her family were all positive for COVID about 1 month ago.  Symptoms from that did improve with some residual cough but has definitely become worse in the last day with some new symptoms of congestion, fatigue, body aches.  The history is provided by the patient.  URI Presenting symptoms: congestion, cough and fatigue        Prior to Admission medications  Medication Sig Start Date End Date Taking? Authorizing Provider  tamoxifen (NOLVADEX) 20 MG tablet Take 20 mg by mouth daily. 10/24/21  Yes [provider]  cetirizine  (ZYRTEC  ALLERGY) 10 MG tablet Take 1 tablet (10 mg total) by mouth daily. 10/10/20 11/09/20  Shepard Clinch, PA-C  fluticasone  (FLONASE ) 50 MCG/ACT nasal spray Place 2 sprays into both nostrils daily. 09/21/20   Couture, Cortni S, PA-C  fluticasone  (FLONASE ) 50 MCG/ACT nasal spray Place 1 spray into both nostrils daily. 10/10/20 11/09/20  Shepard Clinch, PA-C  omeprazole  (PRILOSEC) 20 MG capsule Take 1 capsule (20 mg total) by mouth daily for 14 days. 08/20/20 09/03/20  Caccavale, Sophia, PA-C  potassium chloride  (KLOR-CON ) 10 MEQ tablet Take 1 tablet (10 mEq total) by mouth daily. 08/20/20   Caccavale, Sophia, PA-C  Vitamin D, Ergocalciferol, (DRISDOL) 1.25 MG (50000 UNIT) CAPS capsule Take 50,000 Units by mouth every 7 (seven) days.    [provider]    Allergies: Patient has no known allergies.    Review of Systems  Constitutional:  Positive for fatigue.  HENT:  Positive for congestion.   Respiratory:  Positive for cough.     Updated Vital Signs BP (!) 129/108   Pulse (!) 119   Temp 99.1 F (37.3 C) (Oral)   Resp 19   Ht 5' 8 (1.727 m)   Wt 61.2 kg   SpO2 98%   BMI 20.53 kg/m   Physical Exam Vitals and nursing note reviewed.  Constitutional:      General: She is not in acute distress.    Appearance: Normal appearance. She is normal weight. She is not ill-appearing, toxic-appearing or diaphoretic.  HENT:     Head: Normocephalic and atraumatic.     Nose: Congestion present.     Mouth/Throat:     Mouth: Mucous membranes are moist.     Pharynx: Oropharynx is clear. No oropharyngeal exudate or posterior oropharyngeal erythema.  Eyes:     General: No scleral icterus.    Extraocular Movements: Extraocular movements intact.     Conjunctiva/sclera: Conjunctivae normal.     Pupils: Pupils are equal, round, and reactive to light.  Cardiovascular:     Rate and Rhythm: Regular rhythm. Tachycardia present.     Heart sounds: Normal heart sounds.     Comments: 110 bpm Pulmonary:     Effort: Pulmonary effort is normal. No respiratory distress.     Breath  sounds: Normal breath sounds. No stridor. No wheezing, rhonchi or rales.  Musculoskeletal:        General: Normal range of motion.     Cervical back: Normal range of motion.  Skin:    General: Skin is warm and dry.     Coloration: Skin is not jaundiced or pale.  Neurological:     Mental Status: She is alert and oriented to person, place, and time.     (all labs ordered are listed, but only abnormal results are displayed) Labs Reviewed  RESP PANEL BY RT-PCR (RSV, FLU A&B, COVID)  [id number] - Abnormal; Notable for the following components:      Result Value   Influenza A by PCR POSITIVE (*)    All other components within normal limits     EKG: None  Radiology: No results found.   Procedures   Medications Ordered in the ED - No data to display   Patient presents to the ED for concern of flulike symptoms, this involves an extensive number of treatment options, and is a complaint that carries with it a high risk of complications and morbidity.  The differential diagnosis includes viral URI, COVID, influenza, RSV, asthma exacerbation.   Co morbidities that complicate the patient evaluation  Asthma   Lab Tests:  I Ordered, and personally interpreted labs.  The pertinent results include: Respiratory panel, influenza A positive.   Problem List / ED Course:     Influenza A+.  No concerning findings on physical exam.  Tachycardic and hypertensive with low-grade fever in the setting of viral illness.  Pulse rate was still elevated during physical exam though slightly reduced from initial reading. Return precautions given for patient as well as her children.  Patient verbalized understanding.  Stable for discharge.   Reevaluation:  After the interventions noted above, I reevaluated the patient and found that they have :stayed the same   Dispostion:  After consideration of the diagnostic results and the patients response to treatment, I feel that the patent would benefit from supportive care in the home setting with use of over-the-counter medications for symptom management and close follow-up with PCP as needed.  Advised on over-the-counter symptom management options for herself and her 3 children.  Return precautions given for patient as well as her children.  Patient verbalized understanding.   Medical Decision Making  This note was produced using Dragon Medical voice recognition. While the provider has reviewed and verified all clinical information, transcription errors may remain.     Final diagnoses:  Influenza    ED Discharge Orders     None          Judith Lewis DELENA Judith Lewis 03/15/24  1319    Emil Share, DO 03/15/24 [phone]

## 2024-03-15 NOTE — Discharge Instructions (Signed)
 It was a pleasure meeting you all today.  As we discussed everyone tested positive for influenza A.  Judith Lewis is also testing positive for coronavirus which may be residual from his recent previous infection of COVID.  Symptom management is recommended for influenza A.  Everyone can use Tylenol and ibuprofen for fevers and bodyaches symptoms.  I recommend a humidifier, saline nasal rinses/Nettie pot, and bulb syringes for ongoing congestion.  Do not use over-the-counter children flu/cold medications for children under 72 years old.  Children can return to school when they are 24 hours fever free without the use of over-the-counter medication. Infectiousness typically peaks within the first 1 to 2 days after symptom onset and declines substantially by day 5-7 and otherwise healthy children.  Please return for further evaluation if fevers persist despite over-the-counter medication, symptoms worsen, symptoms improve and then return, or any new concerning symptoms develop.

## 2024-03-15 NOTE — ED Triage Notes (Signed)
 Pt c/o cough, fatigue, congestion. Denies fever, sore throat.    Good po intake. Denies urinary sx.   Took tylenol appx 0800 today.
